# Patient Record
Sex: Female | Born: 1935 | Race: White | Hispanic: No | Marital: Married | Smoking: Never smoker
Health system: Southern US, Community
[De-identification: ages and names within clinical notes are randomized; demographics above are authoritative.]

## PROBLEM LIST (undated history)

## (undated) DIAGNOSIS — M1990 Unspecified osteoarthritis, unspecified site: Secondary | ICD-10-CM

## (undated) DIAGNOSIS — R112 Nausea with vomiting, unspecified: Secondary | ICD-10-CM

## (undated) DIAGNOSIS — D126 Benign neoplasm of colon, unspecified: Secondary | ICD-10-CM

## (undated) DIAGNOSIS — N952 Postmenopausal atrophic vaginitis: Secondary | ICD-10-CM

## (undated) DIAGNOSIS — Z9889 Other specified postprocedural states: Secondary | ICD-10-CM

## (undated) DIAGNOSIS — L900 Lichen sclerosus et atrophicus: Secondary | ICD-10-CM

## (undated) HISTORY — DX: Other specified postprocedural states: Z98.890

## (undated) HISTORY — DX: Lichen sclerosus et atrophicus: L90.0

## (undated) HISTORY — PX: TONSILLECTOMY AND ADENOIDECTOMY: SHX28

## (undated) HISTORY — DX: Unspecified osteoarthritis, unspecified site: M19.90

## (undated) HISTORY — DX: Postmenopausal atrophic vaginitis: N95.2

## (undated) HISTORY — DX: Nausea with vomiting, unspecified: R11.2

## (undated) HISTORY — DX: Benign neoplasm of colon, unspecified: D12.6

---

## 1999-04-12 ENCOUNTER — Encounter: Admission: RE | Admit: 1999-04-12 | Discharge: 1999-04-12 | Payer: Self-pay | Admitting: Obstetrics and Gynecology

## 1999-04-20 ENCOUNTER — Other Ambulatory Visit: Admission: RE | Admit: 1999-04-20 | Discharge: 1999-04-20 | Payer: Self-pay | Admitting: Obstetrics and Gynecology

## 1999-06-21 ENCOUNTER — Encounter: Payer: Self-pay | Admitting: Obstetrics and Gynecology

## 1999-06-21 ENCOUNTER — Encounter: Admission: RE | Admit: 1999-06-21 | Discharge: 1999-06-21 | Payer: Self-pay | Admitting: Internal Medicine

## 2000-04-13 ENCOUNTER — Encounter: Admission: RE | Admit: 2000-04-13 | Discharge: 2000-04-13 | Payer: Self-pay | Admitting: Obstetrics and Gynecology

## 2000-04-13 ENCOUNTER — Encounter: Payer: Self-pay | Admitting: Obstetrics and Gynecology

## 2000-05-22 ENCOUNTER — Other Ambulatory Visit: Admission: RE | Admit: 2000-05-22 | Discharge: 2000-05-22 | Payer: Self-pay | Admitting: Obstetrics and Gynecology

## 2001-05-01 ENCOUNTER — Encounter: Admission: RE | Admit: 2001-05-01 | Discharge: 2001-05-01 | Payer: Self-pay | Admitting: Obstetrics and Gynecology

## 2001-05-01 ENCOUNTER — Encounter: Payer: Self-pay | Admitting: Obstetrics and Gynecology

## 2001-05-11 ENCOUNTER — Other Ambulatory Visit: Admission: RE | Admit: 2001-05-11 | Discharge: 2001-05-11 | Payer: Self-pay | Admitting: *Deleted

## 2002-05-08 ENCOUNTER — Encounter: Payer: Self-pay | Admitting: Obstetrics and Gynecology

## 2002-05-08 ENCOUNTER — Encounter: Admission: RE | Admit: 2002-05-08 | Discharge: 2002-05-08 | Payer: Self-pay | Admitting: Radiology

## 2002-05-15 ENCOUNTER — Other Ambulatory Visit: Admission: RE | Admit: 2002-05-15 | Discharge: 2002-05-15 | Payer: Self-pay | Admitting: *Deleted

## 2003-05-20 ENCOUNTER — Encounter: Admission: RE | Admit: 2003-05-20 | Discharge: 2003-05-20 | Payer: Self-pay | Admitting: Obstetrics and Gynecology

## 2003-05-21 ENCOUNTER — Other Ambulatory Visit: Admission: RE | Admit: 2003-05-21 | Discharge: 2003-05-21 | Payer: Self-pay | Admitting: Obstetrics and Gynecology

## 2004-05-20 ENCOUNTER — Encounter: Admission: RE | Admit: 2004-05-20 | Discharge: 2004-05-20 | Payer: Self-pay | Admitting: Obstetrics and Gynecology

## 2004-07-02 ENCOUNTER — Other Ambulatory Visit: Admission: RE | Admit: 2004-07-02 | Discharge: 2004-07-02 | Payer: Self-pay | Admitting: Obstetrics and Gynecology

## 2005-06-10 ENCOUNTER — Encounter: Admission: RE | Admit: 2005-06-10 | Discharge: 2005-06-10 | Payer: Self-pay | Admitting: Obstetrics and Gynecology

## 2005-11-15 ENCOUNTER — Other Ambulatory Visit: Admission: RE | Admit: 2005-11-15 | Discharge: 2005-11-15 | Payer: Self-pay | Admitting: Obstetrics & Gynecology

## 2005-11-25 ENCOUNTER — Encounter: Admission: RE | Admit: 2005-11-25 | Discharge: 2005-11-25 | Payer: Self-pay | Admitting: Obstetrics and Gynecology

## 2006-06-15 ENCOUNTER — Encounter: Admission: RE | Admit: 2006-06-15 | Discharge: 2006-06-15 | Payer: Self-pay | Admitting: Family Medicine

## 2006-07-04 DIAGNOSIS — D126 Benign neoplasm of colon, unspecified: Secondary | ICD-10-CM

## 2006-07-04 HISTORY — DX: Benign neoplasm of colon, unspecified: D12.6

## 2007-03-07 ENCOUNTER — Ambulatory Visit: Payer: Self-pay | Admitting: Gastroenterology

## 2007-03-23 ENCOUNTER — Encounter: Payer: Self-pay | Admitting: Gastroenterology

## 2007-03-23 ENCOUNTER — Ambulatory Visit: Payer: Self-pay | Admitting: Gastroenterology

## 2007-06-19 ENCOUNTER — Encounter: Admission: RE | Admit: 2007-06-19 | Discharge: 2007-06-19 | Payer: Self-pay | Admitting: Family Medicine

## 2008-02-29 ENCOUNTER — Other Ambulatory Visit: Admission: RE | Admit: 2008-02-29 | Discharge: 2008-02-29 | Payer: Self-pay | Admitting: Obstetrics and Gynecology

## 2008-07-08 ENCOUNTER — Encounter: Admission: RE | Admit: 2008-07-08 | Discharge: 2008-07-08 | Payer: Self-pay | Admitting: Family Medicine

## 2009-07-09 ENCOUNTER — Encounter: Admission: RE | Admit: 2009-07-09 | Discharge: 2009-07-09 | Payer: Self-pay | Admitting: Family Medicine

## 2010-07-21 ENCOUNTER — Encounter
Admission: RE | Admit: 2010-07-21 | Discharge: 2010-07-21 | Payer: Self-pay | Source: Home / Self Care | Attending: Family Medicine | Admitting: Family Medicine

## 2010-07-25 ENCOUNTER — Encounter: Payer: Self-pay | Admitting: Family Medicine

## 2010-07-27 ENCOUNTER — Encounter
Admission: RE | Admit: 2010-07-27 | Discharge: 2010-07-27 | Payer: Self-pay | Source: Home / Self Care | Attending: Family Medicine | Admitting: Family Medicine

## 2011-05-05 LAB — HM PAP SMEAR

## 2011-08-15 ENCOUNTER — Other Ambulatory Visit: Payer: Self-pay | Admitting: Family Medicine

## 2011-08-15 DIAGNOSIS — Z1231 Encounter for screening mammogram for malignant neoplasm of breast: Secondary | ICD-10-CM

## 2011-08-23 ENCOUNTER — Ambulatory Visit
Admission: RE | Admit: 2011-08-23 | Discharge: 2011-08-23 | Disposition: A | Discharge status: Home / Self Care | Payer: Medicare Other | Source: Ambulatory Visit | Attending: Family Medicine | Admitting: Family Medicine

## 2011-08-23 DIAGNOSIS — Z1231 Encounter for screening mammogram for malignant neoplasm of breast: Secondary | ICD-10-CM

## 2012-02-01 ENCOUNTER — Encounter: Payer: Self-pay | Admitting: Gastroenterology

## 2012-06-18 ENCOUNTER — Ambulatory Visit (INDEPENDENT_AMBULATORY_CARE_PROVIDER_SITE_OTHER): Payer: Medicare Other | Admitting: Sports Medicine

## 2012-06-18 VITALS — BP 179/82 | Ht 59.0 in | Wt 140.0 lb

## 2012-06-18 DIAGNOSIS — M1710 Unilateral primary osteoarthritis, unspecified knee: Secondary | ICD-10-CM

## 2012-06-18 DIAGNOSIS — M1712 Unilateral primary osteoarthritis, left knee: Secondary | ICD-10-CM

## 2012-06-18 DIAGNOSIS — M25561 Pain in right knee: Secondary | ICD-10-CM

## 2012-06-18 DIAGNOSIS — M25569 Pain in unspecified knee: Secondary | ICD-10-CM

## 2012-06-18 DIAGNOSIS — IMO0002 Reserved for concepts with insufficient information to code with codable children: Secondary | ICD-10-CM

## 2012-06-18 DIAGNOSIS — M1711 Unilateral primary osteoarthritis, right knee: Secondary | ICD-10-CM

## 2012-06-18 DIAGNOSIS — M25562 Pain in left knee: Secondary | ICD-10-CM

## 2012-06-19 NOTE — Progress Notes (Signed)
  Subjective:    Patient ID: Vanessa Clements, female    DOB: 1936/01/24, 76 y.o.   MRN: [id number]  HPI chief complaint: Bilateral knee pain   Very pleasant 76 year old female comes in today complaining of bilateral knee pain. I cared for this patient for many years at [facility]. 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 She comes in periodically for cortisone injections. Last injections provided her with about 6 months of relief. She denies any new trauma. Pain is diffuse in both knees, worse with activity. Pain is identical in nature to what he's experienced previously with her DJD. She is requesting repeat cortisone injections today.  Medications include lisinopril, vitamin D She is allergic to codeine Socially she does not smoke, denies alcohol use, and is retired   Review of Systems     Objective:   Physical Exam Well-developed, well-nourished. No acute distress. Awake alert and oriented x3  Each of her knees shows a 5 extension lag with flexion to 110. No effusion. Bony hypertrophy consistent with DJD. No popliteal cyst. She is tender to palpation along the medial joint lines bilaterally. Knees are grossly stable to ligamentous exam. She is neurovascularly intact distally. She walks with a varus thrust.  X-rays were not repeated. Last x-rays done at [facility] showed bone-on-bone end-stage DJD in each knee       Assessment & Plan:  1. Bilateral knee pain secondary to severe, end-stage, bone-on-bone DJD  Patient is not interested in total knee arthroplasty. Cortisone injections do provide her with several months of symptom relief. I've agreed to reinject each of her knees today. Left knee was injected using an anterior lateral approach and the right knee was injected using an anterior medial approach. Patient tolerated this without difficulty. Resume activity as tolerated. Although a when  necessary.  Consent obtained and verified. Time-out conducted. Noted no overlying erythema, induration, or other signs of local infection. Skin prepped in a sterile fashion. Topical analgesic spray: Ethyl chloride. Joint: right knee Needle: 25g 1 1/2 inch Completed without difficulty. Meds: 3cc 0.5% marcaine, 1cc(40mg ) depomedrol  Advised to call if fevers/chills, erythema, induration, drainage, or persistent bleeding.  Consent obtained and verified. Time-out conducted. Noted no overlying erythema, induration, or other signs of local infection. Skin prepped in a sterile fashion. Topical analgesic spray: Ethyl chloride. Joint: left knee Needle: 25g 1 1/2 inch Completed without difficulty. Meds: 3cc 0.5% marcaine, 1cc (40mg ) depomedrol  Advised to call if fevers/chills, erythema, induration, drainage, or persistent bleeding.

## 2012-08-20 ENCOUNTER — Other Ambulatory Visit: Payer: Self-pay | Admitting: Family Medicine

## 2012-08-20 DIAGNOSIS — Z1231 Encounter for screening mammogram for malignant neoplasm of breast: Secondary | ICD-10-CM

## 2012-09-13 ENCOUNTER — Ambulatory Visit: Payer: Medicare Other

## 2012-09-24 ENCOUNTER — Ambulatory Visit
Admission: RE | Admit: 2012-09-24 | Discharge: 2012-09-24 | Disposition: A | Discharge status: Home / Self Care | Payer: Medicare Other | Source: Ambulatory Visit | Attending: Family Medicine | Admitting: Family Medicine

## 2012-09-24 DIAGNOSIS — Z1231 Encounter for screening mammogram for malignant neoplasm of breast: Secondary | ICD-10-CM

## 2012-10-18 ENCOUNTER — Encounter: Payer: Self-pay | Admitting: Gastroenterology

## 2012-12-12 ENCOUNTER — Ambulatory Visit (INDEPENDENT_AMBULATORY_CARE_PROVIDER_SITE_OTHER): Payer: Medicare Other | Admitting: Sports Medicine

## 2012-12-12 ENCOUNTER — Encounter: Payer: Self-pay | Admitting: Sports Medicine

## 2012-12-12 VITALS — BP 181/79 | HR 81 | Ht 59.0 in | Wt 140.0 lb

## 2012-12-12 DIAGNOSIS — M1711 Unilateral primary osteoarthritis, right knee: Secondary | ICD-10-CM

## 2012-12-12 DIAGNOSIS — M1710 Unilateral primary osteoarthritis, unspecified knee: Secondary | ICD-10-CM

## 2012-12-12 DIAGNOSIS — M25562 Pain in left knee: Secondary | ICD-10-CM

## 2012-12-12 DIAGNOSIS — IMO0002 Reserved for concepts with insufficient information to code with codable children: Secondary | ICD-10-CM

## 2012-12-12 DIAGNOSIS — M1712 Unilateral primary osteoarthritis, left knee: Secondary | ICD-10-CM

## 2012-12-12 DIAGNOSIS — M25569 Pain in unspecified knee: Secondary | ICD-10-CM

## 2012-12-12 DIAGNOSIS — M25561 Pain in right knee: Secondary | ICD-10-CM

## 2012-12-12 MED ORDER — METHYLPREDNISOLONE ACETATE 40 MG/ML IJ SUSP
40.0000 mg | Freq: Once | INTRAMUSCULAR | Status: AC
  Administered 2012-12-12: 40 mg via INTRA_ARTICULAR

## 2012-12-12 NOTE — Progress Notes (Signed)
  Subjective:    Patient ID: Vanessa Clements, female    DOB: [birth year], 77 y.o.   MRN: [id number]  HPI Patient comes in today requesting repeat cortisone injections into each of her knees. She has a well-documented history of end-stage bilateral knee DJD. She is not currently interested in total knee arthroplasty. Knees were last injected back in December. She denies any recent trauma.  Interim medical history is unchanged. She takes lisinopril and vitamin D    Review of Systems     Objective:   Physical Exam Well-developed, well-nourished. No acute distress.  Examination of each of her knees shows range of motion from 0-120. 1+ patellofemoral crepitus. Moderate varus deformity bilaterally. No effusion. Slight tenderness to palpation along the medial joint line. Negative McMurray's. Knees are grossly stable to ligamentous exam. Neurovascularly intact distally. Walking with only a slight limp.       Assessment & Plan:  1. Returning bilateral knee pain secondary to end-stage DJD  Again, patient is not interested in total knee arthroplasty. I have agreed to reinject each of her knees. Left knee was injected using an anterior lateral approach and the right knee was injected using an anterior medial approach. I've agreed to reinject her knees in 6 months if needed.  Consent obtained and verified. Time-out conducted. Noted no overlying erythema, induration, or other signs of local infection. Skin prepped in a sterile fashion. Topical analgesic spray: Ethyl chloride. Joint: right knee Needle: 25g 1 1/2 inch Completed without difficulty. Meds: 3cc 1% xylocaine, 1cc (40mg ) depomedrol  Advised to call if fevers/chills, erythema, induration, drainage, or persistent bleeding.  Consent obtained and verified. Time-out conducted. Noted no overlying erythema, induration, or other signs of local infection. Skin prepped in a sterile fashion. Topical analgesic spray: Ethyl  chloride. Joint: left knee Needle: 25g 1 1/2 inch Completed without difficulty. Meds: 3cc 1% xylocaine, 1cc (40mg ) depomedrol  Advised to call if fevers/chills, erythema, induration, drainage, or persistent bleeding.

## 2012-12-13 ENCOUNTER — Other Ambulatory Visit: Payer: Self-pay | Admitting: Dermatology

## 2013-09-03 ENCOUNTER — Other Ambulatory Visit: Payer: Self-pay

## 2013-09-03 DIAGNOSIS — Z1231 Encounter for screening mammogram for malignant neoplasm of breast: Secondary | ICD-10-CM

## 2013-09-25 ENCOUNTER — Ambulatory Visit
Admission: RE | Admit: 2013-09-25 | Discharge: 2013-09-25 | Disposition: A | Discharge status: Home / Self Care | Payer: Medicare Other | Source: Ambulatory Visit

## 2013-09-25 DIAGNOSIS — Z1231 Encounter for screening mammogram for malignant neoplasm of breast: Secondary | ICD-10-CM

## 2013-10-03 ENCOUNTER — Ambulatory Visit: Payer: Self-pay | Admitting: Nurse Practitioner

## 2013-10-16 ENCOUNTER — Other Ambulatory Visit: Payer: Self-pay | Admitting: Dermatology

## 2013-10-29 ENCOUNTER — Ambulatory Visit: Payer: Self-pay | Admitting: Nurse Practitioner

## 2013-11-05 ENCOUNTER — Encounter: Payer: Self-pay | Admitting: Nurse Practitioner

## 2013-11-06 ENCOUNTER — Ambulatory Visit (INDEPENDENT_AMBULATORY_CARE_PROVIDER_SITE_OTHER): Payer: Medicare Other | Admitting: Nurse Practitioner

## 2013-11-06 ENCOUNTER — Encounter: Payer: Self-pay | Admitting: Nurse Practitioner

## 2013-11-06 VITALS — BP 134/68 | HR 68 | Resp 16 | Ht 58.5 in | Wt 155.0 lb

## 2013-11-06 DIAGNOSIS — Z01419 Encounter for gynecological examination (general) (routine) without abnormal findings: Secondary | ICD-10-CM

## 2013-11-06 MED ORDER — ESTROGENS, CONJUGATED 0.625 MG/GM VA CREA: 1.0000 | TOPICAL_CREAM | VAGINAL | Status: DC | PRN

## 2013-11-06 NOTE — Patient Instructions (Signed)

## 2013-11-06 NOTE — Progress Notes (Signed)
78 y.o. G43P2002 Married Caucasian Fe here for annual exam.  No vaso symptoms.  Using vaginal cream usually before exams other times is rarely.  Not SA now.  Patient's last menstrual period was 07/04/1998.          Sexually active: no  The current method of family planning is vasectomy.    Exercising: no  exercise Smoker:  no  Health Maintenance: Pap:  05-05-11 neg MMG:  09-25-13 normal Colonoscopy:  9/08 due in 2013 BMD:   2011 TDaP:  Unsure but given within 10 Labs: none Self breast exam: not done   reports that she has never smoked. She has never used smokeless tobacco. She reports that she does not drink alcohol or use illicit drugs.  Past Medical History  Diagnosis Date  . Lichen sclerosus   . Adenomatous polyp of colon 2008  . Atrophic vaginitis   . Arthritis     hands & knees    Past Surgical History  Procedure Laterality Date  . Tonsillectomy and adenoidectomy  childhood    Current Outpatient Prescriptions  Medication Sig Dispense Refill  . cetirizine (ZYRTEC) 10 MG tablet Take 10 mg by mouth daily.      . cholecalciferol (VITAMIN D) 1000 UNITS tablet Take 2,000 Units by mouth daily.      Marland Kitchen conjugated estrogens (PREMARIN) vaginal cream Place 1 Applicatorful vaginally as needed.  42.5 g  3  . lisinopril (PRINIVIL,ZESTRIL) 10 MG tablet Take 10 mg by mouth daily.      . Vitamin D, Ergocalciferol, (DRISDOL) 50000 UNITS CAPS capsule Take 50,000 Units by mouth every 7 (seven) days.       No current facility-administered medications for this visit.    Family History  Problem Relation Age of Onset  . Thyroid disease Mother   . Osteoarthritis Mother   . Cancer Father     prostate & spread to lungs  . Thyroid disease Maternal Grandmother     goiter    ROS:  Pertinent items are noted in HPI.  Otherwise, a comprehensive ROS was negative.  Exam:   BP 134/68  Pulse 68  Resp 16  Ht 4' 10.5" (1.486 m)  Wt 155 lb (70.308 kg)  BMI 31.84 kg/m2  LMP 07/04/1998 Height:  4' 10.5" (148.6 cm)  Ht Readings from Last 3 Encounters:  11/06/13 4' 10.5" (1.486 m)  12/12/12 4\' 11"  (1.499 m)  06/18/12 4\' 11"  (1.499 m)    General appearance: alert, cooperative and appears stated age Head: Normocephalic, without obvious abnormality, atraumatic Neck: no adenopathy, supple, symmetrical, trachea midline and thyroid normal to inspection and palpation Lungs: clear to auscultation bilaterally Breasts: normal appearance, no masses or tenderness Heart: regular rate and rhythm Abdomen: soft, non-tender; no masses,  no organomegaly Extremities: extremities normal, atraumatic, no cyanosis or edema Skin: Skin color, texture, turgor normal. No rashes or lesions Lymph nodes: Cervical, supraclavicular, and axillary nodes normal. No abnormal inguinal nodes palpated Neurologic: Grossly normal   Pelvic: External genitalia:  no lesions              Urethra:  normal appearing urethra with no masses, tenderness or lesions              Bartholin's and Skene's: normal                 Vagina: very atrophic appearing vagina with pale color and discharge, no lesions              Cervix: anteverted  Pap taken: yes Bimanual Exam:  Uterus:  normal size, contour, position, consistency, mobility, non-tender              Adnexa: no mass, fullness, tenderness               Rectovaginal: Confirms               Anus:  normal sphincter tone, no lesions  A:  Well Woman with normal exam  Postmenopausal off HRT 2000  Atrophic vaginitis - rare use of vaginal estrogen  P:   Reviewed health and wellness pertinent to exam  Pap smear taken today - request one every 2 years  Mammogram due 3/16  Refilled Premarin Vaginal cream in case it is needed  Counseled with risk of DVT, CVA, cancer, etc  Counseled on breast self exam, mammography screening, adequate intake of calcium and vitamin D, diet and exercise, Kegel's exercises return annually or prn  An After Visit Summary was printed  and given to the patient.

## 2013-11-10 NOTE — Progress Notes (Signed)
Encounter reviewed by Dr. Brook Silva.  

## 2013-11-11 LAB — IPS PAP SMEAR ONLY

## 2014-01-08 ENCOUNTER — Encounter: Payer: Self-pay | Admitting: Sports Medicine

## 2014-01-08 ENCOUNTER — Ambulatory Visit (INDEPENDENT_AMBULATORY_CARE_PROVIDER_SITE_OTHER): Payer: Medicare Other | Admitting: Sports Medicine

## 2014-01-08 VITALS — BP 148/80 | Ht 59.0 in | Wt 145.0 lb

## 2014-01-08 DIAGNOSIS — M1712 Unilateral primary osteoarthritis, left knee: Secondary | ICD-10-CM

## 2014-01-08 DIAGNOSIS — M1710 Unilateral primary osteoarthritis, unspecified knee: Secondary | ICD-10-CM

## 2014-01-08 MED ORDER — METHYLPREDNISOLONE ACETATE 40 MG/ML IJ SUSP
40.0000 mg | Freq: Once | INTRAMUSCULAR | Status: AC
  Administered 2014-01-08: 40 mg via INTRA_ARTICULAR

## 2014-01-09 NOTE — Progress Notes (Signed)
   Subjective:    Patient ID: Vanessa Clements, female    DOB: Jun 08, 1936, 78 y.o.   MRN: [id number]  HPI Patient comes in today requesting a repeat cortisone injection into her left knee. She has a well-documented history of end-stage DJD in both knees. Although she is getting pain in both knees currently, left knee is most painful and this is the knee that she would like to have injected today. No trauma. She also gets stiffness with prolonged sitting. No swelling.    Review of Systems     Objective:   Physical Exam Well-developed, well-nourished. No acute distress. Vital signs reviewed. Awake alert and oriented x3.  Left knee: Range of motion 0-120. 1+ body synovitis but no effusion. Slight tenderness to palpation along the medial joint line but not markedly. No Baker's cyst. Neurovascularly intact distally. Walking with a slight limp.       Assessment & Plan:  Returning left knee pain secondary to end-stage DJD  Left knee is reinjected today with cortisone. An anterior lateral approach was utilized. Patient tolerated this without difficulty. If right knee becomes more symptomatic she will return to the office for consideration of repeat cortisone injection here. Otherwise, followup when necessary.  Consent obtained and verified. Time-out conducted. Noted no overlying erythema, induration, or other signs of local infection. Skin prepped in a sterile fashion. Topical analgesic spray: Ethyl chloride. Joint: left knee Needle: 22g 1.5 inch Completed without difficulty. Meds: 3cc 1% xylocaine, 1cc (40mg ) depomedrol  Advised to call if fevers/chills, erythema, induration, drainage, or persistent bleeding.

## 2014-05-05 ENCOUNTER — Encounter: Payer: Self-pay | Admitting: Sports Medicine

## 2014-05-22 ENCOUNTER — Other Ambulatory Visit: Payer: Self-pay | Admitting: Otolaryngology

## 2014-05-22 DIAGNOSIS — IMO0002 Reserved for concepts with insufficient information to code with codable children: Secondary | ICD-10-CM

## 2014-06-05 ENCOUNTER — Ambulatory Visit
Admission: RE | Admit: 2014-06-05 | Discharge: 2014-06-05 | Disposition: A | Discharge status: Home / Self Care | Payer: Medicare Other | Source: Ambulatory Visit | Attending: Otolaryngology | Admitting: Otolaryngology

## 2014-06-05 DIAGNOSIS — IMO0002 Reserved for concepts with insufficient information to code with codable children: Secondary | ICD-10-CM

## 2014-06-20 ENCOUNTER — Other Ambulatory Visit: Payer: Self-pay | Admitting: Otolaryngology

## 2014-06-20 DIAGNOSIS — IMO0002 Reserved for concepts with insufficient information to code with codable children: Secondary | ICD-10-CM

## 2014-07-08 ENCOUNTER — Other Ambulatory Visit: Payer: Self-pay | Admitting: Otolaryngology

## 2014-07-08 DIAGNOSIS — IMO0002 Reserved for concepts with insufficient information to code with codable children: Secondary | ICD-10-CM

## 2014-07-10 ENCOUNTER — Ambulatory Visit
Admission: RE | Admit: 2014-07-10 | Discharge: 2014-07-10 | Disposition: A | Discharge status: Home / Self Care | Payer: Medicare Other | Source: Ambulatory Visit | Attending: Otolaryngology | Admitting: Otolaryngology

## 2014-07-10 ENCOUNTER — Other Ambulatory Visit (HOSPITAL_COMMUNITY)
Admission: RE | Admit: 2014-07-10 | Discharge: 2014-07-10 | Disposition: A | Discharge status: Home / Self Care | Payer: Medicare Other | Source: Ambulatory Visit | Attending: Interventional Radiology | Admitting: Interventional Radiology

## 2014-07-10 ENCOUNTER — Ambulatory Visit
Admission: RE | Admit: 2014-07-10 | Discharge: 2014-07-10 | Disposition: A | Discharge status: Home / Self Care | Payer: Self-pay | Source: Ambulatory Visit | Attending: Otolaryngology | Admitting: Otolaryngology

## 2014-07-10 DIAGNOSIS — IMO0002 Reserved for concepts with insufficient information to code with codable children: Secondary | ICD-10-CM

## 2014-07-10 DIAGNOSIS — E041 Nontoxic single thyroid nodule: Secondary | ICD-10-CM | POA: Diagnosis present

## 2014-08-26 ENCOUNTER — Other Ambulatory Visit: Payer: Self-pay

## 2014-08-26 DIAGNOSIS — Z1231 Encounter for screening mammogram for malignant neoplasm of breast: Secondary | ICD-10-CM

## 2014-10-07 ENCOUNTER — Ambulatory Visit: Payer: Medicare Other

## 2014-10-20 ENCOUNTER — Ambulatory Visit
Admission: RE | Admit: 2014-10-20 | Discharge: 2014-10-20 | Disposition: A | Discharge status: Home / Self Care | Payer: Medicare Other | Source: Ambulatory Visit

## 2014-10-20 DIAGNOSIS — Z1231 Encounter for screening mammogram for malignant neoplasm of breast: Secondary | ICD-10-CM

## 2014-12-05 ENCOUNTER — Other Ambulatory Visit: Payer: Self-pay | Admitting: Otolaryngology

## 2014-12-05 DIAGNOSIS — IMO0002 Reserved for concepts with insufficient information to code with codable children: Secondary | ICD-10-CM

## 2014-12-23 ENCOUNTER — Ambulatory Visit
Admission: RE | Admit: 2014-12-23 | Discharge: 2014-12-23 | Disposition: A | Discharge status: Home / Self Care | Payer: Medicare Other | Source: Ambulatory Visit | Attending: Otolaryngology | Admitting: Otolaryngology

## 2014-12-23 ENCOUNTER — Other Ambulatory Visit (HOSPITAL_COMMUNITY)
Admission: RE | Admit: 2014-12-23 | Discharge: 2014-12-23 | Disposition: A | Discharge status: Home / Self Care | Payer: Medicare Other | Source: Ambulatory Visit | Attending: Interventional Radiology | Admitting: Interventional Radiology

## 2014-12-23 DIAGNOSIS — E041 Nontoxic single thyroid nodule: Secondary | ICD-10-CM | POA: Diagnosis present

## 2014-12-23 DIAGNOSIS — IMO0002 Reserved for concepts with insufficient information to code with codable children: Secondary | ICD-10-CM

## 2015-02-26 ENCOUNTER — Ambulatory Visit (INDEPENDENT_AMBULATORY_CARE_PROVIDER_SITE_OTHER): Payer: Medicare Other | Admitting: Internal Medicine

## 2015-02-26 ENCOUNTER — Encounter: Payer: Self-pay | Admitting: Internal Medicine

## 2015-02-26 VITALS — BP 122/72 | HR 100 | Temp 98.6°F | Resp 12 | Ht 59.0 in | Wt 164.0 lb

## 2015-02-26 DIAGNOSIS — E042 Nontoxic multinodular goiter: Secondary | ICD-10-CM | POA: Diagnosis not present

## 2015-02-26 NOTE — Patient Instructions (Signed)
Please stop at the lab.  Dr. Gala Lewandowsky office will call you with an appointment.  Please come back for a follow-up appointment in 2 months.

## 2015-02-26 NOTE — Progress Notes (Signed)
Patient ID: Vanessa Clements, female   DOB: [birth year], 79 y.o.   MRN: [id number]   HPI  Vanessa Clements is a 79 y.o.-year-old female, referred by her ENT dr, Dr Dagmar Hait, for evaluation and management of MNG.  Thyroid U/S (12/23/2014): Multiple thyroid nodules, of which the largest is in the right lower lobe, measuring 3 x 4.6 x 1.7 cm. This nodule has been biopsied twice, on 07/15/2014 and on 12/23/2014, and the results were indeterminate (follicular lesion of undetermined significance) in both situations. Another biopsy was performed of the dominant left thyroid nodule, measuring 1.9 x 1.4 x 1.5 cm and was benign on 07/15/2014.    I reviewed pt's thyroid tests: 04/2014: TSH normal by Dr. Ignacia Bayley (ENT)  Pt denies feeling nodules in neck, hoarseness, dysphagia/odynophagia, SOB with lying down.  Pt denies: - heat intolerance/cold intolerance - tremors - palpitations - anxiety/depression - hyperdefecation/constipation - weight loss - weight gain - dry skin - hairloss - fatigue  Pt does have a FH of thyroid ds.: GM with goiter, mother with mild hypothyroidism. No FH of thyroid cancer. No h/o radiation tx to head or neck.  She lived in [place], [place] state - 40 miles away from a Dietitian. She then [place], [place].  No seaweed or kelp, no recent contrast studies. No steroid use. No herbal supplements.   ROS: Constitutional: no weight gain/loss, no fatigue, no subjective hyperthermia/hypothermia Eyes: no blurry vision, no xerophthalmia ENT: no sore throat, no nodules palpated in throat, no dysphagia/odynophagia, no hoarseness, + hypoacusis Cardiovascular: no CP/SOB/palpitations/leg swelling Respiratory: no cough/SOB Gastrointestinal: no N/V/D/C Musculoskeletal: no muscle/+ joint aches Skin: + rash Neurological: no tremors/numbness/tingling/dizziness Psychiatric: no depression/anxiety  Past Medical History  Diagnosis Date  . Lichen  sclerosus   . Adenomatous polyp of colon 2008  . Atrophic vaginitis   . Arthritis     hands & knees   Past Surgical History  Procedure Laterality Date  . Tonsillectomy and adenoidectomy  childhood   Social History   Social History  . Marital Status: Married    Spouse Name: N/A  . Number of Children: 2  . Years of Education: N/A   Occupational History  . Not on file.   Social History Main Topics  . Smoking status: Never Smoker   . Smokeless tobacco: Never Used  . Alcohol Use: No  . Drug Use: No  . Sexual Activity: No     Comment: husband vasectomy   Other Topics Concern  . Not on file   Social History Narrative   Current Outpatient Prescriptions on File Prior to Visit  Medication Sig Dispense Refill  . cetirizine (ZYRTEC) 10 MG tablet Take 10 mg by mouth daily.    . cholecalciferol (VITAMIN D) 1000 UNITS tablet Take 2,000 Units by mouth daily.    Marland Kitchen lisinopril (PRINIVIL,ZESTRIL) 10 MG tablet Take 10 mg by mouth daily.    Marland Kitchen conjugated estrogens (PREMARIN) vaginal cream Place 1 Applicatorful vaginally as needed. (Patient not taking: Reported on 02/26/2015) 42.5 g 3  . Vitamin D, Ergocalciferol, (DRISDOL) 50000 UNITS CAPS capsule Take 50,000 Units by mouth every 7 (seven) days.     No current facility-administered medications on file prior to visit.   Allergies  Allergen Reactions  . Codeine Nausea And Vomiting    fainting  . Neomycin-Bacitracin Zn-Polymyx Other (See Comments)    Blisters skin  . Sulfa Antibiotics    Family History  Problem Relation Age of Onset  .  Thyroid disease Mother   . Osteoarthritis Mother   . Cancer Father     prostate & spread to lungs  . Thyroid disease Maternal Grandmother     goiter   PE: BP 122/72 mmHg  Pulse 100  Temp(Src) 98.6 F (37 C) (Oral)  Resp 12  Ht 4\' 11"  (1.499 m)  Wt 164 lb (74.39 kg)  BMI 33.11 kg/m2  SpO2 97%  LMP 07/04/1998 Wt Readings from Last 3 Encounters:  02/26/15 164 lb (74.39 kg)  01/08/14 145 lb  (65.772 kg)  11/06/13 155 lb (70.308 kg)   Constitutional: overweight, in NAD Eyes: PERRLA, EOMI, no exophthalmos ENT: moist mucous membranes, + thyromegaly R>L , no cervical lymphadenopathy Cardiovascular: RRR, No MRG Respiratory: CTA B Gastrointestinal: abdomen soft, NT, ND, BS+ Musculoskeletal: no deformities, strength intact in all 4;  Skin: moist, warm, no rashes Neurological: no tremor with outstretched hands, DTR normal in all 4  ASSESSMENT: 1. MNG  2. FLUS x 2 in RLL thyroid nodule  PLAN: 1. And 2.  - I reviewed the images of her thyroid ultrasound along with the patient. I pointed out that the  right dominant nodule is large and the biopsy has been inconclusive 2x. This is an indication for surgery. The guidelines recommend lobectomy, however, I'm worried that if this turns out to be cancerous, she will need to have completion thyroidectomy, therefore, 2 surgeries. She is a highly functioning 79 year old woman, looking and acting much younger than her age, however, I would still want to avoid her potentially having to go through 2 surgeries. I had a long discussion with the patient and recommended total thyroidectomy, which she agrees with. I explained that the rest of the nodules are smaller, the left dominant nodule has been biopsied and it has been benign, so there is no clear indication for taking the left thyroid lobe out, the only concern is for need for repeat intervention in case of malignancy. She understands this but would still want to proceed with total thyroidectomy. - We discussed about thyroid cancer, the fact that this is an indolent malignancy, which usually does not reduce her life expectancy. - We also discussed about thyroid hormone replacement after surgery  - I recommended to take the thyroid hormone every day, with water, >30 minutes before breakfast, separated by >4 hours from acid reflux medications, calcium, iron, multivitamins. - I would like to see her  back in approximately 1-1.5 mo  after the surgery - for today, would like to check a TSH, since we do not have any recent level   Lab Results  Component Value Date   TSH 0.927 02/27/2015   TSH is normal.

## 2015-02-27 ENCOUNTER — Other Ambulatory Visit: Payer: Medicare Other

## 2015-02-27 DIAGNOSIS — E042 Nontoxic multinodular goiter: Secondary | ICD-10-CM

## 2015-02-28 LAB — TSH: TSH: 0.927 u[IU]/mL (ref 0.450–4.500)

## 2015-03-02 ENCOUNTER — Telehealth: Payer: Self-pay | Admitting: Internal Medicine

## 2015-03-02 NOTE — Telephone Encounter (Signed)
Patient called stating that she would like to speak with Dr. Arman Filter assistant  She has some concerns she would like to discuss    Please advise   Thank you

## 2015-03-02 NOTE — Telephone Encounter (Signed)
I would highly recommend Dr Harlow Asa. He is operating on a lot thyroid patients with great results. Please do not go by that one bad review. I would suggest that she at least meets with him once the discussed the surgery and then let me know if she wants to see somebody else.  Please let me know what she decides. If she insists on seeing somebody else, we can definitely do that.

## 2015-03-02 NOTE — Telephone Encounter (Signed)
Returned Vanessa Clements's call. Vanessa Clements stated that she looked up the surgeon your recommended to her and she saw a bad review on him; she would like for Dr Cruzita Lederer to refer her to another surgeon, one who does thyroid surgeries more often. Be advised.

## 2015-03-04 NOTE — Telephone Encounter (Signed)
Called pt and advised her per Dr Arman Filter message. Pt stated she understands, but she has never had surgery before and would prefer to be seen by a surgeon who is a thyroid Psychologist, sport and exercise; preferably in [place], [place] or at [facility]. Pt stated she is not trying to be hard to get along with, she just wants to be sure that she gets the best surgeon possible. Pt was very nice about it, just really wants to go to a thyroid surgeon. Be advised.

## 2015-03-05 NOTE — Telephone Encounter (Signed)
I understand that. No problem! Is she interested in seeing Dr. Rico Ala at [facility]? - he is a thyroid surgeon and he is excellent! If not, I will need to find out about a good surgeon in one of the academic centers nearby.

## 2015-03-05 NOTE — Telephone Encounter (Signed)
Pt returned call. She stated that she would prefer to see a surgeon who is closer. [facility] is too far for her at this time. Be advised.

## 2015-03-05 NOTE — Telephone Encounter (Signed)
Called pt and lvm advising her per Dr Arman Filter message below. Advised her to return call.

## 2015-03-06 NOTE — Telephone Encounter (Signed)
Will place the referral. No problem!

## 2015-03-06 NOTE — Telephone Encounter (Signed)
Called pt and advised her per Dr Arman Filter message below. Pt stated that she knows of this physician/surgeon and would like for Dr Cruzita Lederer to go ahead with the referral to her. Thank you so much for working with her she said.

## 2015-03-06 NOTE — Telephone Encounter (Signed)
Can you please tell her to research Dr. Eliezer Champagne at [facility]? I can refer her there. She specializes in endocrine surgery.

## 2015-03-10 ENCOUNTER — Other Ambulatory Visit: Payer: Self-pay | Admitting: Internal Medicine

## 2015-03-10 DIAGNOSIS — E042 Nontoxic multinodular goiter: Secondary | ICD-10-CM

## 2015-04-28 ENCOUNTER — Ambulatory Visit: Payer: Medicare Other | Admitting: Internal Medicine

## 2015-06-04 HISTORY — PX: THYROIDECTOMY: SHX17

## 2015-06-08 ENCOUNTER — Telehealth: Payer: Self-pay | Admitting: Internal Medicine

## 2015-06-08 NOTE — Telephone Encounter (Signed)
Patient called stating that she had her thyroid removed and was advised to see her Endocrinologist 6 weeks after   At this time Dr. Cruzita Lederer does not have an appointment time to accommodate this request  Next available appointment is 1.27.16 at 4pm; did Dr. Cruzita Lederer need to see her sooner than this?  Please advise   Thank you

## 2015-06-08 NOTE — Telephone Encounter (Signed)
Please read message and advise

## 2015-06-08 NOTE — Telephone Encounter (Signed)
Called pt and advised her per Dr Arman Filter message below. Pt voiced understanding. Scheduled labs for 6 weeks post-op, Jan 12th and f/up for Jan 30th. Be advised.

## 2015-06-08 NOTE — Telephone Encounter (Signed)
OK to see her then, but let's order a TSH and a free t4 for 5 weeks after the thyroidectomy.  Thank you!

## 2015-07-16 ENCOUNTER — Other Ambulatory Visit (INDEPENDENT_AMBULATORY_CARE_PROVIDER_SITE_OTHER): Payer: Medicare Other

## 2015-07-16 ENCOUNTER — Other Ambulatory Visit: Payer: Self-pay | Admitting: *Deleted

## 2015-07-16 DIAGNOSIS — E042 Nontoxic multinodular goiter: Secondary | ICD-10-CM

## 2015-07-16 LAB — T4, FREE: FREE T4: 1.31 ng/dL (ref 0.60–1.60)

## 2015-07-16 LAB — TSH: TSH: 3.36 u[IU]/mL (ref 0.35–4.50)

## 2015-07-21 ENCOUNTER — Telehealth: Payer: Self-pay | Admitting: Internal Medicine

## 2015-07-21 MED ORDER — LEVOTHYROXINE SODIUM 112 MCG PO TABS: ORAL_TABLET | ORAL | Status: DC

## 2015-07-21 NOTE — Telephone Encounter (Signed)
Pt needs levothyroxine refill called to [facility] on [address]

## 2015-08-03 ENCOUNTER — Ambulatory Visit (INDEPENDENT_AMBULATORY_CARE_PROVIDER_SITE_OTHER): Payer: Medicare Other | Admitting: Internal Medicine

## 2015-08-03 ENCOUNTER — Encounter: Payer: Self-pay | Admitting: Internal Medicine

## 2015-08-03 VITALS — BP 124/82 | HR 86 | Temp 97.8°F | Resp 12 | Wt 163.8 lb

## 2015-08-03 DIAGNOSIS — E890 Postprocedural hypothyroidism: Secondary | ICD-10-CM | POA: Diagnosis not present

## 2015-08-03 DIAGNOSIS — Z8639 Personal history of other endocrine, nutritional and metabolic disease: Secondary | ICD-10-CM | POA: Diagnosis not present

## 2015-08-03 NOTE — Progress Notes (Signed)
Patient ID: Vanessa Clements, female   DOB: March 07, 1936, 80 y.o.   MRN: [id number]   HPI  Vanessa Clements is a 80 y.o.-year-old female, initially referred by her ENT dr, Dr Dagmar Hait, now returning for f/u for postsurgical hypothyroidism after total thyroidectomy for MNG. Last visit 5 mo ago.  Pt has a h/o MNG, with 2x FLUS biopsies.   Thyroid U/S (12/23/2014): Multiple thyroid nodules, of which the largest is in the right lower lobe, measuring 3 x 4.6 x 1.7 cm. This nodule has been biopsied twice, on 07/15/2014 and on 12/23/2014, and the results were indeterminate (follicular lesion of undetermined significance) in both situations. Another biopsy was performed of the dominant left thyroid nodule, measuring 1.9 x 1.4 x 1.5 cm and was benign on 07/15/2014.    She had total thyroidectomy at [facility] (Dr Eliezer Champagne) on 06/04/2015 - had a great experience! She feels well after the surgery, has plenty of energy and no complaints.  I reviewed pt's thyroid tests: Lab Results  Component Value Date   TSH 3.36 07/16/2015   TSH 0.927 02/27/2015   FREET4 1.31 07/16/2015  04/2014: TSH normal by Dr. Ignacia Bayley (ENT)  She is now on Levothyroxine 112 mcg daily: - in am - with water - 1h before b'fast - calcium 600 mg at night   Pt denies hoarseness, dysphagia/odynophagia, dysesthesia at the cervical scar site.  Pt does have a FH of thyroid ds.: GM with goiter, mother with mild hypothyroidism. No FH of thyroid cancer. No h/o radiation tx to head or neck.  ROS: Constitutional: no weight gain/loss, no fatigue, no subjective hyperthermia/hypothermia Eyes: no blurry vision, no xerophthalmia ENT: no sore throat, no nodules palpated in throat, no dysphagia/odynophagia, no hoarseness Cardiovascular: no CP/SOB/palpitations/leg swelling Respiratory: no cough/SOB Gastrointestinal: no N/V/D/C Musculoskeletal: no muscle/+ joint aches Skin: no rash Neurological: no  tremors/numbness/tingling/dizziness  I reviewed pt's medications, allergies, PMH, social hx, family hx, and changes were documented in the history of present illness. Otherwise, unchanged from my initial visit note.  Past Medical History  Diagnosis Date  . Lichen sclerosus   . Adenomatous polyp of colon 2008  . Atrophic vaginitis   . Arthritis     hands & knees   Past Surgical History  Procedure Laterality Date  . Tonsillectomy and adenoidectomy  childhood   Social History   Social History  . Marital Status: Married    Spouse Name: N/A  . Number of Children: 2  . Years of Education: N/A   Occupational History  . Not on file.   Social History Main Topics  . Smoking status: Never Smoker   . Smokeless tobacco: Never Used  . Alcohol Use: No  . Drug Use: No  . Sexual Activity: No     Comment: husband vasectomy   Other Topics Concern  . Not on file   Social History Narrative   Current Outpatient Prescriptions on File Prior to Visit  Medication Sig Dispense Refill  . cetirizine (ZYRTEC) 10 MG tablet Take 10 mg by mouth daily.    . cholecalciferol (VITAMIN D) 1000 UNITS tablet Take 2,000 Units by mouth daily.    Marland Kitchen levothyroxine (SYNTHROID, LEVOTHROID) 112 MCG tablet TAKE 1 TAB EVERY MORNING 30-60 MINUTES BEFORE BREAKFAST ON EMPTY STOMACH WITH GLASS OF WATER 45 tablet 2  . lisinopril (PRINIVIL,ZESTRIL) 10 MG tablet Take 10 mg by mouth daily.     No current facility-administered medications on file prior to visit.   Allergies  Allergen Reactions  .  Codeine Nausea And Vomiting    fainting  . Neomycin-Bacitracin Zn-Polymyx Other (See Comments)    Blisters skin  . Sulfa Antibiotics    Family History  Problem Relation Age of Onset  . Thyroid disease Mother   . Osteoarthritis Mother   . Cancer Father     prostate & spread to lungs  . Thyroid disease Maternal Grandmother     goiter   PE: BP 124/82 mmHg  Pulse 86  Temp(Src) 97.8 F (36.6 C) (Oral)  Resp 12  Wt  163 lb 12.8 oz (74.299 kg)  SpO2 95%  LMP 07/04/1998 Wt Readings from Last 3 Encounters:  08/03/15 163 lb 12.8 oz (74.299 kg)  02/26/15 164 lb (74.39 kg)  01/08/14 145 lb (65.772 kg)   Constitutional: overweight, in NAD Eyes: PERRLA, EOMI, no exophthalmos ENT: moist mucous membranes, thyroidectomy scar slightly erythematous, nonpainful, w/o swelling , no cervical lymphadenopathy Cardiovascular: RRR, No MRG Respiratory: CTA B Gastrointestinal: abdomen soft, NT, ND, BS+ Musculoskeletal: no deformities, strength intact in all 4;  Skin: moist, warm, no rashes Neurological: no tremor with outstretched hands, DTR normal in all 4  ASSESSMENT: 1. H/o MNG - FLUS x 2 in RLL thyroid nodule - now s/p total thyroidectomy at [facility]  2. Postsurgical hypothyroidism  PLAN: 1.  - Pt with a h/o multinodular goiter, with one nodule biopsy beeing inconclusive 2x.  I recommended surgery especially to avoid a reintervention in case se turned out to have cancer >> she had the surgery 2 months ago and the pathology was benign. She is not having any complaints about her thyroidectomy scar.  2. Postsurgical hypothyroidism - on LT4 112 mcg, with normal TFTs at last check this month - we discussed about taking the thyroid hormone every day, with water, at least 30 minutes before breakfast, separated by at least 4 hours from: - acid reflux medications - calcium - iron - multivitamins She is taking it correctly - will recheck TFTs in 4-5 weeks  3. Postsurgical hypocalcemia - resolved, but last calcium level at the [facility] per [facility] records - continue Calcium 600 mg daily for now and recheck Calcium at next lab draw in 1 mo >> will try to reduce her dose then

## 2015-08-03 NOTE — Patient Instructions (Signed)
Please come back for labs in 4-5 weeks.  Please continue Calcium 600 mg daily for now.  Continue Levothyroxine 112 mcg daily.  Take the thyroid hormone every day, with water, at least 30 minutes before breakfast, separated by at least 4 hours from: - acid reflux medications - calcium - iron - multivitamins  Please come back for a follow-up appointment in 6 months.

## 2015-08-26 ENCOUNTER — Other Ambulatory Visit: Payer: Self-pay | Admitting: *Deleted

## 2015-08-26 MED ORDER — LEVOTHYROXINE SODIUM 112 MCG PO TABS: ORAL_TABLET | ORAL | Status: DC

## 2015-09-14 ENCOUNTER — Other Ambulatory Visit: Payer: Self-pay

## 2015-09-14 ENCOUNTER — Encounter: Payer: Self-pay | Admitting: Gastroenterology

## 2015-09-14 DIAGNOSIS — Z1231 Encounter for screening mammogram for malignant neoplasm of breast: Secondary | ICD-10-CM

## 2015-09-15 ENCOUNTER — Other Ambulatory Visit (INDEPENDENT_AMBULATORY_CARE_PROVIDER_SITE_OTHER): Payer: Medicare Other

## 2015-09-15 ENCOUNTER — Other Ambulatory Visit: Payer: Self-pay | Admitting: *Deleted

## 2015-09-15 DIAGNOSIS — E890 Postprocedural hypothyroidism: Secondary | ICD-10-CM | POA: Diagnosis not present

## 2015-09-15 LAB — T4, FREE: Free T4: 1.26 ng/dL (ref 0.60–1.60)

## 2015-09-15 LAB — TSH: TSH: 2.55 u[IU]/mL (ref 0.35–4.50)

## 2015-09-15 LAB — CALCIUM: CALCIUM: 9.8 mg/dL (ref 8.4–10.5)

## 2015-09-15 MED ORDER — LEVOTHYROXINE SODIUM 112 MCG PO TABS: ORAL_TABLET | ORAL | Status: DC

## 2015-09-23 ENCOUNTER — Telehealth: Payer: Self-pay | Admitting: Internal Medicine

## 2015-09-23 NOTE — Telephone Encounter (Signed)
Called pt advised her per Dr Ronnie Derby note. Pt voiced understanding. Dr Cruzita Lederer please read message below and advise.

## 2015-09-23 NOTE — Telephone Encounter (Signed)
Usually not related to thyroid medication, she needs to discuss with [facility] tomorrow

## 2015-09-23 NOTE — Telephone Encounter (Signed)
Please read message below and advise in Dr Arman Filter absence. Thank you.

## 2015-09-23 NOTE — Telephone Encounter (Signed)
Pt is saying she is having some swelling in her legs and feet, she was concerned it had something to do with her thyroid medication

## 2015-09-24 NOTE — Telephone Encounter (Signed)
Called pt and advised her per Dr Arman Filter message. Pt stated she wore a knee brace all day on Tues and Wed b/c she has a bad knee. She feels this may have attributed to the feet swelling. Pt has no swelling today. She will keep an eye on it and contact her PCP if it happens again.

## 2015-09-24 NOTE — Telephone Encounter (Signed)
I agree. TFTs are normal. The leg swelling is not related to the thyroid meds.

## 2015-10-20 ENCOUNTER — Ambulatory Visit (AMBULATORY_SURGERY_CENTER): Payer: Self-pay

## 2015-10-20 VITALS — Ht 59.5 in | Wt 164.8 lb

## 2015-10-20 DIAGNOSIS — Z86010 Personal history of colonic polyps: Secondary | ICD-10-CM

## 2015-10-20 MED ORDER — NA SULFATE-K SULFATE-MG SULF 17.5-3.13-1.6 GM/177ML PO SOLN: ORAL | Status: DC

## 2015-10-20 NOTE — Progress Notes (Signed)
Per pt, no allergies to soy or egg products.Pt not taking any weight loss meds or using  O2 at home. 

## 2015-10-21 ENCOUNTER — Ambulatory Visit: Payer: Medicare Other

## 2015-11-03 ENCOUNTER — Encounter: Payer: Medicare Other | Admitting: Gastroenterology

## 2015-11-10 ENCOUNTER — Ambulatory Visit
Admission: RE | Admit: 2015-11-10 | Discharge: 2015-11-10 | Disposition: A | Discharge status: Home / Self Care | Payer: Medicare Other | Source: Ambulatory Visit

## 2015-11-10 DIAGNOSIS — Z1231 Encounter for screening mammogram for malignant neoplasm of breast: Secondary | ICD-10-CM

## 2015-11-24 ENCOUNTER — Encounter: Payer: Self-pay | Admitting: Gastroenterology

## 2015-12-01 ENCOUNTER — Telehealth: Payer: Self-pay | Admitting: Gastroenterology

## 2015-12-01 NOTE — Telephone Encounter (Signed)
Spoke with pt.She states she is unable to drink the Suprep bowel prep and is requesting the Miralax prep.Will mail new Miralax prep instructions to pt and fax instructions to pt at work also( per her request.)She will call if she has further questions. Gwyndolyn Saxon

## 2015-12-08 ENCOUNTER — Ambulatory Visit (AMBULATORY_SURGERY_CENTER): Payer: Medicare Other | Admitting: Gastroenterology

## 2015-12-08 ENCOUNTER — Encounter: Payer: Self-pay | Admitting: Gastroenterology

## 2015-12-08 VITALS — BP 150/58 | HR 74 | Temp 97.8°F | Resp 18 | Ht 59.5 in | Wt 164.0 lb

## 2015-12-08 DIAGNOSIS — Z86010 Personal history of colonic polyps: Secondary | ICD-10-CM | POA: Diagnosis not present

## 2015-12-08 DIAGNOSIS — K621 Rectal polyp: Secondary | ICD-10-CM

## 2015-12-08 DIAGNOSIS — D125 Benign neoplasm of sigmoid colon: Secondary | ICD-10-CM

## 2015-12-08 DIAGNOSIS — D129 Benign neoplasm of anus and anal canal: Secondary | ICD-10-CM

## 2015-12-08 DIAGNOSIS — D124 Benign neoplasm of descending colon: Secondary | ICD-10-CM

## 2015-12-08 DIAGNOSIS — D128 Benign neoplasm of rectum: Secondary | ICD-10-CM

## 2015-12-08 MED ORDER — SODIUM CHLORIDE 0.9 % IV SOLN: 500.0000 mL | INTRAVENOUS | Status: DC

## 2015-12-08 NOTE — Progress Notes (Signed)
No egg or soy allergy known to patient  No issues with past sedation with any surgeries or procedures, no intubation problems - after surgery 6 months ago at [facility] had nausea post op  No diet pills per patient No home 02 use per patient  No blood thinners per patient  Pt denies issues with constipation

## 2015-12-08 NOTE — Op Note (Signed)
[place] Patient Name: Vanessa Clements Procedure Date: 12/08/2015 11:32 AM MRN: [id number] Endoscopist: Ladene Artist , MD Age: 80 Referring MD:  Date of Birth: Jan 10, 1936 Gender: Female Procedure:                Colonoscopy Indications:              Surveillance: Personal history of adenomatous                            polyps on last colonoscopy > 5 years ago Medicines:                Monitored Anesthesia Care Procedure:                Pre-Anesthesia Assessment:                           - Prior to the procedure, a History and Physical                            was performed, and patient medications and                            allergies were reviewed. The patient's tolerance of                            previous anesthesia was also reviewed. The risks                            and benefits of the procedure and the sedation                            options and risks were discussed with the patient.                            All questions were answered, and informed consent                            was obtained. Prior Anticoagulants: The patient has                            taken no previous anticoagulant or antiplatelet                            agents. ASA Grade Assessment: II - A patient with                            mild systemic disease. After reviewing the risks                            and benefits, the patient was deemed in                            satisfactory condition to undergo the procedure.  After obtaining informed consent, the colonoscope                            was passed under direct vision. Throughout the                            procedure, the patient's blood pressure, pulse, and                            oxygen saturations were monitored continuously. The                            Model CF-HQ190L [id number]) scope was introduced                            through the anus and advanced to the  the cecum,                            identified by appendiceal orifice and ileocecal                            valve. The colonoscopy was performed without                            difficulty. The patient tolerated the procedure                            well. The quality of the bowel preparation was                            excellent. The ileocecal valve, appendiceal                            orifice, and rectum were photographed. Scope In: 11:41:29 AM Scope Out: 11:58:28 AM Scope Withdrawal Time: 0 hours 14 minutes 37 seconds  Total Procedure Duration: 0 hours 16 minutes 59 seconds  Findings:                 The digital rectal exam was normal.                           Two sessile polyps were found in the sigmoid colon                            and descending colon. The polyps were 6 to 7 mm in                            size. These polyps were removed with a cold snare.                            Resection and retrieval were complete.                           A 4 mm polyp was found in  the rectum. The polyp was                            sessile. The polyp was removed with a cold biopsy                            forceps. Resection and retrieval were complete.                           A few small-mouthed diverticula were found in the                            sigmoid colon and descending colon. There was no                            evidence of diverticular bleeding.                           The exam was otherwise normal throughout the                            examined colon.                           The retroflexed view of the distal rectum and anal                            verge was normal and showed no anal or rectal                            abnormalities. Complications:            No immediate complications. Estimated Blood Loss:     Estimated blood loss: none. Impression:               - Two 6 to 7 mm polyps in the sigmoid colon and in                             the descending colon, removed with a cold snare.                            Resected and retrieved.                           - One 4 mm polyp in the rectum, removed with a cold                            biopsy forceps. Resected and retrieved.                           - Mild diverticulosis in the sigmoid colon and in                            the descending colon. There was no evidence of  diverticular bleeding. Recommendation:           - Patient has a contact number available for                            emergencies. The signs and symptoms of potential                            delayed complications were discussed with the                            patient. Return to normal activities tomorrow.                            Written discharge instructions were provided to the                            patient.                           - High fiber diet.                           - Continue present medications.                           - Await pathology results.                           - No repeat colonoscopy due to age. Ladene Artist, MD 12/08/2015 12:03:36 PM This report has been signed electronically.

## 2015-12-08 NOTE — Patient Instructions (Signed)
YOU HAD AN ENDOSCOPIC PROCEDURE TODAY AT THE [facility]:   Refer to the procedure report that was given to you for any specific questions about what was found during the examination.  If the procedure report does not answer your questions, please call your gastroenterologist to clarify.  If you requested that your care partner not be given the details of your procedure findings, then the procedure report has been included in a sealed envelope for you to review at your convenience later.  YOU SHOULD EXPECT: Some feelings of bloating in the abdomen. Passage of more gas than usual.  Walking can help get rid of the air that was put into your GI tract during the procedure and reduce the bloating. If you had a lower endoscopy (such as a colonoscopy or flexible sigmoidoscopy) you may notice spotting of blood in your stool or on the toilet paper. If you underwent a bowel prep for your procedure, you may not have a normal bowel movement for a few days.  Please Note:  You might notice some irritation and congestion in your nose or some drainage.  This is from the oxygen used during your procedure.  There is no need for concern and it should clear up in a day or so.  SYMPTOMS TO REPORT IMMEDIATELY:   Following lower endoscopy (colonoscopy or flexible sigmoidoscopy):  Excessive amounts of blood in the stool  Significant tenderness or worsening of abdominal pains  Swelling of the abdomen that is new, acute  Fever of 100F or higher   For urgent or emergent issues, a gastroenterologist can be reached at any hour by calling [phone].   DIET: Your first meal following the procedure should be a small meal and then it is ok to progress to your normal diet. Heavy or fried foods are harder to digest and may make you feel nauseous or bloated.  Likewise, meals heavy in dairy and vegetables can increase bloating.  Drink plenty of fluids but you should avoid alcoholic beverages for 24 hours. Try to  increase the fiber in your diet, and drink plenty of water.  ACTIVITY:  You should plan to take it easy for the rest of today and you should NOT DRIVE or use heavy machinery until tomorrow (because of the sedation medicines used during the test).    FOLLOW UP: Our staff will call the number listed on your records the next business day following your procedure to check on you and address any questions or concerns that you may have regarding the information given to you following your procedure. If we do not reach you, we will leave a message.  However, if you are feeling well and you are not experiencing any problems, there is no need to return our call.  We will assume that you have returned to your regular daily activities without incident.  If any biopsies were taken you will be contacted by phone or by letter within the next 1-3 weeks.  Please call us at [phone] if you have not heard about the biopsies in 3 weeks.    SIGNATURES/CONFIDENTIALITY: You and/or your care partner have signed paperwork which will be entered into your electronic medical record.  These signatures attest to the fact that that the information above on your After Visit Summary has been reviewed and is understood.  Full responsibility of the confidentiality of this discharge information lies with you and/or your care-partner.  Read all of the handouts given to you by your recovery   room nurse.  Thank-you for choosing us for your healthcare needs today. 

## 2015-12-08 NOTE — Progress Notes (Signed)
Report to PACU, RN, vss, BBS= Clear.  

## 2015-12-08 NOTE — Progress Notes (Signed)
Called to room to assist during endoscopic procedure.  Patient ID and intended procedure confirmed with present staff. Received instructions for my participation in the procedure from the performing physician.  

## 2015-12-09 ENCOUNTER — Telehealth: Payer: Self-pay | Admitting: *Deleted

## 2015-12-09 NOTE — Telephone Encounter (Signed)
  Follow up Call-  Call back number 12/08/2015  Post procedure Call Back phone  # [phone]  Permission to leave phone message Yes     Patient questions:  Do you have a fever, pain , or abdominal swelling? No. Pain Score  0 *  Have you tolerated food without any problems? Yes.    Have you been able to return to your normal activities? Yes.    Do you have any questions about your discharge instructions: Diet   No. Medications  No. Follow up visit  No.  Do you have questions or concerns about your Care? No.  Actions: * If pain score is 4 or above: No action needed, pain <4.

## 2015-12-14 ENCOUNTER — Encounter: Payer: Self-pay | Admitting: Gastroenterology

## 2015-12-17 ENCOUNTER — Telehealth: Payer: Self-pay | Admitting: Gastroenterology

## 2015-12-17 NOTE — Telephone Encounter (Signed)
Discussed with pt that due to her age she would not need another recall colon. Let pt know that if she has any other issues or problems to contact the office.

## 2016-02-01 ENCOUNTER — Encounter: Payer: Self-pay | Admitting: Internal Medicine

## 2016-02-01 ENCOUNTER — Ambulatory Visit (INDEPENDENT_AMBULATORY_CARE_PROVIDER_SITE_OTHER): Payer: Medicare Other | Admitting: Internal Medicine

## 2016-02-01 VITALS — BP 148/84 | HR 74 | Wt 168.0 lb

## 2016-02-01 DIAGNOSIS — E890 Postprocedural hypothyroidism: Secondary | ICD-10-CM | POA: Diagnosis not present

## 2016-02-01 LAB — TSH: TSH: 3.12 u[IU]/mL (ref 0.35–4.50)

## 2016-02-01 LAB — T4, FREE: FREE T4: 1.21 ng/dL (ref 0.60–1.60)

## 2016-02-01 NOTE — Patient Instructions (Signed)
Please reduce the calcium to 600 mg at night.  Please continue Levothyroxine 112 mcg daily.  Take the thyroid hormone every day, with water, at least 30 minutes before breakfast, separated by at least 4 hours from: - acid reflux medications - calcium - iron - multivitamins  Please come back for a follow-up appointment in 6 months.

## 2016-02-01 NOTE — Progress Notes (Signed)
Patient ID: Vanessa Clements, female   DOB: 1935-11-29, 80 y.o.   MRN: [id number]   HPI  Vanessa Clements is a 80 y.o.-year-old female, initially referred by her ENT dr, Dr Dagmar Hait, now returning for f/u for postsurgical hypothyroidism after total thyroidectomy for MNG. Last visit 6 mo ago.  Pt has a h/o MNG, with 2x FLUS biopsies >> thyroidectomy >> benign pathology.  She had total thyroidectomy at [facility] (Dr Eliezer Champagne) on 06/04/2015 - had a great experience! She feels well after the surgery, has plenty of energy and no complaints.  I reviewed pt's thyroid tests: Lab Results  Component Value Date   TSH 2.55 09/15/2015   TSH 3.36 07/16/2015   TSH 0.927 02/27/2015   FREET4 1.26 09/15/2015   FREET4 1.31 07/16/2015  04/2014: TSH normal by Dr. Ignacia Bayley (ENT)  She is now on Levothyroxine 112 mcg daily: - in am - with water - 1h before b'fast - calcium 1200 mg at night  - vit D 2000 units at night  She had transient hypocalcemia after the surgery, which has resolved. She is continuing with 600 mg x2 tablets of calcium at night. No numbness, muscle cramps.  Pt denies hoarseness, dysphagia/odynophagia, dysesthesia at the cervical scar site.  Pt does have a FH of thyroid ds.: GM with goiter, mother with mild hypothyroidism. No FH of thyroid cancer. No h/o radiation tx to head or neck.  ROS: Constitutional: no weight gain/loss, no fatigue, no subjective hyperthermia/hypothermia Eyes: no blurry vision, no xerophthalmia ENT: no sore throat, no nodules palpated in throat, no dysphagia/odynophagia, no hoarseness Cardiovascular: no CP/SOB/palpitations/leg swelling Respiratory: no cough/SOB Gastrointestinal: no N/V/D/C Musculoskeletal: no muscle/+ joint aches Skin: no rash Neurological: no tremors/numbness/tingling/dizziness  I reviewed pt's medications, allergies, PMH, social hx, family hx, and changes were documented in the history of present illness. Otherwise, unchanged from  my initial visit note.  Past Medical History:  Diagnosis Date  . Adenomatous polyp of colon 2008  . Arthritis    hands & knees  . Atrophic vaginitis    in past  . Lichen sclerosus    Per pt, denies this   . Post-operative nausea and vomiting    no vomiting   Past Surgical History:  Procedure Laterality Date  . THYROIDECTOMY  06/04/15   At [facility]  . TONSILLECTOMY AND ADENOIDECTOMY  childhood   Social History   Social History  . Marital status: Married    Spouse name: N/A  . Number of children: 2  . Years of education: N/A   Occupational History  . Not on file.   Social History Main Topics  . Smoking status: Never Smoker  . Smokeless tobacco: Never Used  . Alcohol use No  . Drug use: No  . Sexual activity: No     Comment: husband vasectomy   Other Topics Concern  . Not on file   Social History Narrative  . No narrative on file   Current Outpatient Prescriptions on File Prior to Visit  Medication Sig Dispense Refill  . bisacodyl (DULCOLAX) 5 MG EC tablet Take 5 mg by mouth once. X 4 for colon 6-6    . Calcium Carb-Cholecalciferol (CALCIUM + D3 PO) Take 1,600 Units by mouth. Take 2 daily    . cetirizine (ZYRTEC) 10 MG tablet Take 10 mg by mouth as needed.     . cholecalciferol (VITAMIN D) 1000 UNITS tablet Take 2,000 Units by mouth daily. Vit d 3 1000 units-Take 2 daily    .  levothyroxine (SYNTHROID, LEVOTHROID) 112 MCG tablet TAKE 1 TAB EVERY MORNING 30-60 MINUTES BEFORE BREAKFAST ON EMPTY STOMACH WITH GLASS OF WATER 90 tablet 1  . lisinopril (PRINIVIL,ZESTRIL) 10 MG tablet Take 10 mg by mouth daily.    . polyethylene glycol powder (MIRALAX) powder Take 1 Container by mouth once. For colon 6-6     No current facility-administered medications on file prior to visit.    Allergies  Allergen Reactions  . Codeine Nausea And Vomiting    fainting  . Neomycin-Bacitracin Zn-Polymyx Other (See Comments)    Blisters skin  . Sulfa Antibiotics     Caused nausea    Family History  Problem Relation Age of Onset  . Thyroid disease Mother   . Osteoarthritis Mother   . Cancer Father     prostate & spread to lungs  . Thyroid disease Maternal Grandmother     goiter   PE: BP (!) 148/84 (BP Location: Left Arm, Patient Position: Sitting)   Pulse 74   Wt 168 lb (76.2 kg)   LMP 07/04/1998   SpO2 97%   BMI 33.36 kg/m  Wt Readings from Last 3 Encounters:  02/01/16 168 lb (76.2 kg)  12/08/15 164 lb (74.4 kg)  10/20/15 164 lb 12.8 oz (74.8 kg)   Constitutional: overweight, in NAD Eyes: PERRLA, EOMI, no exophthalmos ENT: moist mucous membranes, thyroidectomy scar slightly erythematous, nonpainful, w/o swelling , no cervical lymphadenopathy Cardiovascular: RRR, No MRG Respiratory: CTA B Gastrointestinal: abdomen soft, NT, ND, BS+ Musculoskeletal: no deformities, strength intact in all 4;  Skin: moist, warm, no rashes Neurological: no tremor with outstretched hands, DTR normal in all 4  ASSESSMENT: 1. H/o MNG - FLUS x 2 in RLL thyroid nodule - now s/p total thyroidectomy at [facility]  2. Postsurgical hypothyroidism  PLAN: 1.  - Pt with a h/o multinodular goiter, with one nodule biopsy beeing inconclusive 2x.  I recommended surgery especially to avoid a reintervention in case se turned out to have cancer >> she had the surgery 2 months ago and the pathology was benign. She is not having any complaints about her thyroidectomy scar.  2. Postsurgical hypothyroidism - on LT4 112 mcg, with normal TFTs 4 mo ago - we discussed about taking the thyroid hormone every day, with water, at least 30 minutes before breakfast, separated by at least 4 hours from: - acid reflux medications - calcium - iron - multivitamins She is taking it correctly - will recheck TFTs - RTC 6 mo  3. Postsurgical hypocalcemia - resolved, per [facility] records - will decrease Calcium from 1200 to 600 mg daily    Needs refills.  Office Visit on 02/01/2016  Component Date Value  Ref Range Status  . Free T4 02/01/2016 1.21  0.60 - 1.60 ng/dL Final  . TSH 02/01/2016 3.12  0.35 - 4.50 uIU/mL Final   Normal labs. Will continue the current dose of levothyroxine.  Philemon Kingdom, MD PhD [facility]

## 2016-02-02 ENCOUNTER — Telehealth: Payer: Self-pay

## 2016-02-02 MED ORDER — LEVOTHYROXINE SODIUM 112 MCG PO TABS: ORAL_TABLET | ORAL | 3 refills | Status: DC

## 2016-02-02 NOTE — Telephone Encounter (Signed)
Called and left message that lab results were normal. Advised patient to call back if any questions or concerns.

## 2016-03-19 ENCOUNTER — Other Ambulatory Visit: Payer: Self-pay | Admitting: Internal Medicine

## 2016-03-21 ENCOUNTER — Other Ambulatory Visit: Payer: Self-pay

## 2016-03-22 ENCOUNTER — Other Ambulatory Visit: Payer: Self-pay | Admitting: Internal Medicine

## 2016-06-06 ENCOUNTER — Encounter: Payer: Self-pay | Admitting: Sports Medicine

## 2016-06-06 ENCOUNTER — Ambulatory Visit (INDEPENDENT_AMBULATORY_CARE_PROVIDER_SITE_OTHER): Payer: Medicare Other | Admitting: Sports Medicine

## 2016-06-06 VITALS — BP 176/62 | Ht 59.0 in | Wt 160.0 lb

## 2016-06-06 DIAGNOSIS — M170 Bilateral primary osteoarthritis of knee: Secondary | ICD-10-CM

## 2016-06-06 MED ORDER — METHYLPREDNISOLONE ACETATE 40 MG/ML IJ SUSP
40.0000 mg | Freq: Once | INTRAMUSCULAR | Status: AC
  Administered 2016-06-06: 40 mg via INTRA_ARTICULAR

## 2016-06-06 NOTE — Progress Notes (Signed)
   Subjective:    Patient ID: Vanessa Clements, female    DOB: 03-06-1936, 80 y.o.   MRN: [id number]  HPI chief complaint: Bilateral knee pain  Patient comes in today complaining of returning bilateral knee pain due to severe DJD. She was last seen in the office 2-1/2 years ago. Cortisone injections were administered which has helped her up until recently. Pain has now returned. It is identical in nature to what she has experienced previously. She describes a toothache type of pain that is worse with activity. Resolves at rest. She denies any recent trauma. She does take 200 mg of Motrin twice a day which does seem to help. She has also tried Tylenol but that does not seem to help as much. She denies any swelling.  Interim medical history reviewed Interim surgery history reviewed Medications reviewed Allergies reviewed    Review of Systems    as above Objective:   Physical Exam  Well-developed, well-nourished. No acute distress. Vital signs reviewed.  Examination of each knee shows range of motion from 0-100. No effusion. She has moderate varus deformity bilaterally. No tenderness to palpation. Good ligamentous stability. 1-2+ patellofemoral crepitus bilaterally. Neurovascularly intact distally. Ambulating with the assistance of a cane.      Assessment & Plan:   Returning bilateral knee pain secondary to severe DJD  Patient is not interested in total knee arthroplasty. Cortisone injections seem to help her for several months. It has been 2-1/2 years since I injected her knees. Therefore, I will go ahead and reinject each knee today. An anterior lateral approach was utilized. Patient tolerates this without difficulty. Follow-up as needed.  Consent obtained and verified. Time-out conducted. Noted no overlying erythema, induration, or other signs of local infection. Skin prepped in a sterile fashion. Topical analgesic spray: Ethyl chloride. Joint: left knee, anterior lateral  approach Needle: 22g 1.5 inch Completed without difficulty. Meds: 3cc 1% xylocaine, 1cc (40mg ) depomedrol  Advised to call if fevers/chills, erythema, induration, drainage, or persistent bleeding.  Consent obtained and verified. Time-out conducted. Noted no overlying erythema, induration, or other signs of local infection. Skin prepped in a sterile fashion. Topical analgesic spray: Ethyl chloride. Joint: right knee, anterior lateral approach Needle: 22g 1.5 inch Completed without difficulty. Meds: 3cc 1% xylocaine, 1cc (40mg ) depomedrol  Advised to call if fevers/chills, erythema, induration, drainage, or persistent bleeding.

## 2016-07-08 ENCOUNTER — Telehealth: Payer: Self-pay

## 2016-07-08 ENCOUNTER — Telehealth: Payer: Self-pay | Admitting: Internal Medicine

## 2016-07-08 NOTE — Telephone Encounter (Signed)
She can just take it tomorrow. No problem.

## 2016-07-08 NOTE — Telephone Encounter (Signed)
P 

## 2016-07-08 NOTE — Telephone Encounter (Signed)
Take it 30 min before lunch today, if possible, if not take it tomorrow

## 2016-07-08 NOTE — Telephone Encounter (Signed)
Called and notified patient not to take medicine today that okay to take tomorrow per Dr.Gherghe. No questions at this time.

## 2016-07-08 NOTE — Telephone Encounter (Signed)
Patient stated she forgot to take her medication levothyroxine,  What should she do ?? Take it tomorrow? Please advise

## 2016-07-08 NOTE — Telephone Encounter (Signed)
Called and advised patient. No questions.

## 2016-07-08 NOTE — Telephone Encounter (Signed)
Note placed. Waiting for MD response.

## 2016-08-02 ENCOUNTER — Encounter: Payer: Self-pay | Admitting: Internal Medicine

## 2016-08-02 ENCOUNTER — Ambulatory Visit (INDEPENDENT_AMBULATORY_CARE_PROVIDER_SITE_OTHER): Payer: Medicare Other | Admitting: Internal Medicine

## 2016-08-02 VITALS — BP 140/84 | HR 93 | Wt 170.0 lb

## 2016-08-02 DIAGNOSIS — E890 Postprocedural hypothyroidism: Secondary | ICD-10-CM | POA: Diagnosis not present

## 2016-08-02 LAB — TSH: TSH: 1.66 u[IU]/mL (ref 0.35–4.50)

## 2016-08-02 LAB — T4, FREE: Free T4: 1.39 ng/dL (ref 0.60–1.60)

## 2016-08-02 NOTE — Patient Instructions (Addendum)
Please stop at the lab.  Please stay on the Levothyroxine 112 mcg.  Take the thyroid hormone every day, with water, at least 30 minutes before breakfast, separated by at least 4 hours from: - acid reflux medications - calcium - iron - multivitamins  Please return in 6 months. 

## 2016-08-02 NOTE — Progress Notes (Signed)
Patient ID: Vanessa Clements, female   DOB: 20-Mar-1936, 81 y.o.   MRN: [id number]   HPI  Vanessa Clements is a 81 y.o.-year-old female, initially referred by her ENT dr, Dr Dagmar Hait, now returning for f/u for postsurgical hypothyroidism after total thyroidectomy for MNG. Last visit 6 mo ago.  Since last visit >> she lost and then gained back ~10 lbs. She had a steroid inj 2 mo ago.  She has a rash on shins >> changed Lisinopril to Losartan-HCT, but still has the rash. She wonders if this is from LT4.  Pt has a h/o MNG, with 2x FLUS biopsies >> thyroidectomy >> benign pathology. She had total thyroidectomy at [facility] (Dr Eliezer Champagne) on 06/04/2015 - had a great experience! She feels well after the surgery, w/o fatigue.   I reviewed pt's thyroid tests: Lab Results  Component Value Date   TSH 3.12 02/01/2016   TSH 2.55 09/15/2015   TSH 3.36 07/16/2015   TSH 0.927 02/27/2015   FREET4 1.21 02/01/2016   FREET4 1.26 09/15/2015   FREET4 1.31 07/16/2015  04/2014: TSH normal by Dr. Ignacia Bayley (ENT)  She is now on Levothyroxine 112 mcg daily: - in am, at 4 am - with water - 3h before b'fast - calcium 1200 mg at bedtime >> now 600 mg Ca - minipills - vit D 2000 units at night >> now 1000 units  She had transient hypocalcemia after the surgery, which has resolved: Lab Results  Component Value Date   CALCIUM 9.8 09/15/2015   Pt denies hoarseness, dysphagia/odynophagia, dysesthesia at the cervical scar site.  Pt does have a FH of thyroid ds.: GM with goiter, mother with mild hypothyroidism. No FH of thyroid cancer. No h/o radiation tx to head or neck.  ROS: Constitutional: + weight gain no fatigue, no subjective hyperthermia/hypothermia Eyes: no blurry vision, no xerophthalmia ENT: no sore throat, no nodules palpated in throat, no dysphagia/odynophagia, no hoarseness Cardiovascular: no CP/SOB/palpitations/leg swelling Respiratory: no cough/SOB Gastrointestinal: no  N/V/D/C Musculoskeletal: no muscle/+ joint aches Skin: + rash - on B shins and dorsum of foot Neurological: no tremors/numbness/tingling/dizziness  I reviewed pt's medications, allergies, PMH, social hx, family hx, and changes were documented in the history of present illness. Otherwise, unchanged from my initial visit note.  Past Medical History:  Diagnosis Date  . Adenomatous polyp of colon 2008  . Arthritis    hands & knees  . Atrophic vaginitis    in past  . Lichen sclerosus    Per pt, denies this   . Post-operative nausea and vomiting    no vomiting   Past Surgical History:  Procedure Laterality Date  . THYROIDECTOMY  06/04/15   At [facility]  . TONSILLECTOMY AND ADENOIDECTOMY  childhood   Social History   Social History  . Marital status: Married    Spouse name: N/A  . Number of children: 2  . Years of education: N/A   Occupational History  . Not on file.   Social History Main Topics  . Smoking status: Never Smoker  . Smokeless tobacco: Never Used  . Alcohol use No  . Drug use: No  . Sexual activity: No     Comment: husband vasectomy   Other Topics Concern  . Not on file   Social History Narrative  . No narrative on file   Current Outpatient Prescriptions on File Prior to Visit  Medication Sig Dispense Refill  . Calcium Carb-Cholecalciferol (CALCIUM + D3 PO) Take 1,600 Units by mouth. Take  2 daily    . cetirizine (ZYRTEC) 10 MG tablet Take 10 mg by mouth as needed.     . cholecalciferol (VITAMIN D) 1000 UNITS tablet Take 1,000 Units by mouth daily. Vit d 3 1000 units-Take 2 daily    . levothyroxine (SYNTHROID, LEVOTHROID) 112 MCG tablet TAKE 1 TAB EVERY MORNING 30-60 MINUTES BEFORE BREAKFAST ON EMPTY STOMACH WITH GLASS OF WATER 90 tablet 3  . lisinopril (PRINIVIL,ZESTRIL) 10 MG tablet Take 10 mg by mouth daily.    Marland Kitchen losartan-hydrochlorothiazide (HYZAAR) 50-12.5 MG tablet     . erythromycin ophthalmic ointment      No current facility-administered  medications on file prior to visit.    Allergies  Allergen Reactions  . Codeine Nausea And Vomiting    fainting  . Neomycin-Bacitracin Zn-Polymyx Other (See Comments)    Blisters skin  . Sulfa Antibiotics     Caused nausea   Family History  Problem Relation Age of Onset  . Thyroid disease Mother   . Osteoarthritis Mother   . Cancer Father     prostate & spread to lungs  . Thyroid disease Maternal Grandmother     goiter   PE: BP 140/84 (BP Location: Left Arm, Patient Position: Sitting)   Pulse 93   Wt 170 lb (77.1 kg)   LMP 07/04/1998   SpO2 98%   BMI 34.34 kg/m  Wt Readings from Last 3 Encounters:  08/02/16 170 lb (77.1 kg)  06/06/16 160 lb (72.6 kg)  02/01/16 168 lb (76.2 kg)   Constitutional: overweight, in NAD Eyes: PERRLA, EOMI, no exophthalmos ENT: moist mucous membranes, thyroidectomy scar healed,  no cervical lymphadenopathy Cardiovascular: RRR, No MRG Respiratory: CTA B Gastrointestinal: abdomen soft, NT, ND, BS+ Musculoskeletal: no deformities, strength intact in all 4;  Skin: moist, warm,+ rash - on B shins and dorsum of foot Neurological: no tremor with outstretched hands, DTR normal in all 4  ASSESSMENT: 1. H/o MNG - FLUS x 2 in RLL thyroid nodule - now s/p total thyroidectomy at [facility]  2. Postsurgical hypothyroidism  PLAN: 1.  - Pt with a h/o multinodular goiter, with one nodule biopsy beeing inconclusive 2x.  I recommended surgery especially to avoid a reintervention in case se turned out to have cancer >> she had the surgery in 06/2015  and the pathology was benign.  - her thyroidectomy scar is completely healed  2. Postsurgical hypothyroidism - on LT4 112 mcg, with normal TFTs at last check. - She complains about having a rash on bilateral shins and dorsum of feet. She already changed her blood pressure medicine, but the rash persisted. She is wondering whether this could be related to the levothyroxine. We decided to check her thyroid tests  today and try to replace her dose with LT4 50 g tablets (e.g. 2.5 tablets for a dose of 125, if we have room to increase the dose). Alternatively, we can try to switch to brand name Synthroid. For now, she opted for trying to generic 50 g tablets. - we discussed about taking the thyroid hormone every day, with water, at least 30 minutes before breakfast, separated by at least 4 hours from: - acid reflux medications - calcium - iron - multivitamins She is taking it correctly, however, she takes calcium at that time and levothyroxine at 4 AM. I advised her to move calcium with dinner since it is better absorbed was taken with food but also to space it more from levothyroxine. - RTC 6 mo  Component  Latest Ref Rng & Units 08/02/2016  TSH     0.35 - 4.50 uIU/mL 1.66  T4,Free(Direct)     0.60 - 1.60 ng/dL 1.39   TFTs are great. We can try 50 mcg  X 2 + 1/4 of another tablet per day - try this for 2-3 weeks. If rash not better, either switch back to LT4 or to Synthroid DAW 112.  Philemon Kingdom, MD PhD [facility] Endocrinology

## 2016-08-04 ENCOUNTER — Other Ambulatory Visit: Payer: Self-pay

## 2016-08-04 ENCOUNTER — Telehealth: Payer: Self-pay

## 2016-08-04 MED ORDER — LEVOTHYROXINE SODIUM 50 MCG PO TABS: ORAL_TABLET | ORAL | 1 refills | Status: DC

## 2016-08-04 NOTE — Telephone Encounter (Signed)
Called and notified patient of lab results, and medication changes. Advised her to try the no dye synthroid to see if it would help with her rash in 2-3 weeks. Patient had no questions and understood.

## 2016-08-04 NOTE — Telephone Encounter (Signed)
-----   Message from Philemon Kingdom, MD sent at 08/03/2016  1:10 PM EST ----- Almyra Free, can you please call pt: Her thyroid tests are great. Please check with her whether she wants to switch to 50 g levothyroxine tablets, as we discussed at the time of the visit, to avoid any dyes in the tablet. The problem is that she will need to take 2 tablets and a quarter every day (to make 112 g per day). I would suggest to try this for 2-3 weeks to see if her rash improves. If it does not improve, we can continue with either levothyroxine 112 g daily or Synthroid brand name 112 g daily. Please let us know. If she does want to start with the 50 g tablet, please send this to the pharmacy.

## 2016-08-04 NOTE — Telephone Encounter (Signed)
Patient is calling for results of labs °

## 2016-08-04 NOTE — Telephone Encounter (Signed)
Returned phone call. LVM, gave call back number.

## 2016-08-04 NOTE — Telephone Encounter (Signed)
Called, LVM to return call for lab results, and medication changes.

## 2016-08-04 NOTE — Telephone Encounter (Signed)
Called and left message regarding lab results. Gave call back number.

## 2016-08-18 ENCOUNTER — Telehealth: Payer: Self-pay | Admitting: Internal Medicine

## 2016-08-18 ENCOUNTER — Telehealth: Payer: Self-pay

## 2016-08-18 NOTE — Telephone Encounter (Signed)
Patient ask if you called her? Please advise

## 2016-08-18 NOTE — Telephone Encounter (Signed)
Called patient back. She thought we had called but it was wrong number. Patient had no other questions at this time.

## 2016-09-02 ENCOUNTER — Encounter: Payer: Self-pay | Admitting: *Deleted

## 2016-09-02 NOTE — Patient Instructions (Signed)
Dr Latanya Maudlin or Mitzi Hansen, the PA [facility] 09/05/16 at [place]   [phone]

## 2016-09-10 ENCOUNTER — Other Ambulatory Visit: Payer: Self-pay | Admitting: Internal Medicine

## 2016-10-21 ENCOUNTER — Telehealth: Payer: Self-pay | Admitting: Sports Medicine

## 2016-10-21 NOTE — Telephone Encounter (Signed)
[facility] Sherren Mocha Early Tuesday 12/20/16 at 1230p  Pt will be placed on a waiting list for earlier openings They will call patient with appt time and date  [address], [place], [place] [address] Phone: [phone]

## 2016-10-21 NOTE — Telephone Encounter (Signed)
I received a phone call from the patient today claiming that she has a new ulcer on her lower leg. A friend of hers that is a nurse thinks that it is from poor circulation in her legs. She has not been evaluated for vascular insufficiency in the past. Therefore, I will refer her to vascular surgery for workup. In the meantime, the nurse friend has experience in wound care and is currently treating her with topical mupirocin. I explained to Vanessa Clements that if the wound gets worse before she is able to see vascular surgery then she needs to see her primary care physician. She was asking about the possibility of cortisone being administered with her Visco supplementation and whether or not that may be the culprit. I explained to her that cortisone is not administered with Visco supplementation so that is not the etiology of her symptoms.

## 2016-10-24 ENCOUNTER — Other Ambulatory Visit: Payer: Self-pay | Admitting: Vascular Surgery

## 2016-10-24 DIAGNOSIS — L98499 Non-pressure chronic ulcer of skin of other sites with unspecified severity: Secondary | ICD-10-CM

## 2016-10-31 ENCOUNTER — Other Ambulatory Visit: Payer: Self-pay | Admitting: Family Medicine

## 2016-10-31 DIAGNOSIS — Z1231 Encounter for screening mammogram for malignant neoplasm of breast: Secondary | ICD-10-CM

## 2016-12-13 ENCOUNTER — Ambulatory Visit
Admission: RE | Admit: 2016-12-13 | Discharge: 2016-12-13 | Disposition: A | Discharge status: Home / Self Care | Payer: Medicare Other | Source: Ambulatory Visit | Attending: Family Medicine | Admitting: Family Medicine

## 2016-12-13 DIAGNOSIS — Z1231 Encounter for screening mammogram for malignant neoplasm of breast: Secondary | ICD-10-CM

## 2016-12-20 ENCOUNTER — Encounter: Payer: Medicare Other | Admitting: Vascular Surgery

## 2016-12-20 ENCOUNTER — Encounter (HOSPITAL_COMMUNITY): Payer: Medicare Other

## 2016-12-21 ENCOUNTER — Other Ambulatory Visit: Payer: Self-pay | Admitting: Internal Medicine

## 2017-01-10 ENCOUNTER — Ambulatory Visit (INDEPENDENT_AMBULATORY_CARE_PROVIDER_SITE_OTHER): Payer: Medicare Other | Admitting: Internal Medicine

## 2017-01-10 ENCOUNTER — Telehealth: Payer: Self-pay

## 2017-01-10 ENCOUNTER — Encounter: Payer: Self-pay | Admitting: Internal Medicine

## 2017-01-10 VITALS — BP 130/84 | HR 98 | Wt 166.0 lb

## 2017-01-10 DIAGNOSIS — E890 Postprocedural hypothyroidism: Secondary | ICD-10-CM | POA: Diagnosis not present

## 2017-01-10 LAB — T4, FREE: FREE T4: 1.09 ng/dL (ref 0.60–1.60)

## 2017-01-10 LAB — TSH: TSH: 4.75 u[IU]/mL — AB (ref 0.35–4.50)

## 2017-01-10 NOTE — Telephone Encounter (Signed)
-----   Message from Philemon Kingdom, MD sent at 01/10/2017  4:55 PM EDT ----- Almyra Free, can you please call pt: Patient's TSH is a little bit high. I think it would be safer to go back to the 112 g daily pill. Alternatively, she can alternate 2x 50 mcg tablets + 1/2 tab with 2x 50 mg tablets + 1/4 tab every other day. We'll need another set of labs in 2 months. I will order them.

## 2017-01-10 NOTE — Telephone Encounter (Signed)
Patient called back. Please call when you can

## 2017-01-10 NOTE — Patient Instructions (Signed)
Please stop at the lab.  Please stay on the Levothyroxine 112 mcg.  Take the thyroid hormone every day, with water, at least 30 minutes before breakfast, separated by at least 4 hours from: - acid reflux medications - calcium - iron - multivitamins  Please return in 6 months.

## 2017-01-10 NOTE — Telephone Encounter (Signed)
Called patient and LVM advising her to call back for lab results and medication changes.

## 2017-01-10 NOTE — Progress Notes (Signed)
Patient ID: Vanessa Clements, female   DOB: May 15, 1936, 81 y.o.   MRN: [id number]   HPI  Vanessa Clements is a 81 y.o.-year-old female, initially referred by her ENT dr, Dr Dagmar Hait, now returning for f/u for postsurgical hypothyroidism after total thyroidectomy for MNG. Last visit 6 months ago.  Reviewed history: Pt has a h/o MNG, with 2x FLUS biopsies >> thyroidectomy >> benign pathology. She had total thyroidectomy at [facility] (Dr Eliezer Champagne) on 06/04/2015 - had a great experience! She feels well after the surgery, w/o fatigue.   However, at last visit, she had the rash, and we had to switch to 50 g levothyroxine tablets since they did not contain coloring agents or other aches and pains. She is currently taking 50 g 2 tablets +1/4 tablet. Rash is now only present on feet, but she still has it. No itching.  I reviewed pt's thyroid tests: Lab Results  Component Value Date   TSH 1.66 08/02/2016   TSH 3.12 02/01/2016   TSH 2.55 09/15/2015   TSH 3.36 07/16/2015   TSH 0.927 02/27/2015   FREET4 1.39 08/02/2016   FREET4 1.21 02/01/2016   FREET4 1.26 09/15/2015   FREET4 1.31 07/16/2015  04/2014: TSH normal by Dr. Ignacia Bayley (ENT)  She is now on Levothyroxine112 g daily (see above) - at 4 AM - with water - separated by ~3 hours from breakfast - calcium at bedtime - no biotin   She had transient hypocalcemia after the surgery, which has resolved: Lab Results  Component Value Date   CALCIUM 9.8 09/15/2015   Pt denies: - feeling nodules in neck - hoarseness - dysphagia - choking - SOB with lying down  Pt does have a FH of thyroid ds.: GM with goiter, mother with mild hypothyroidism. No FH of thyroid cancer. No h/o radiation tx to head or neck.  No seaweed or kelp. No recent contrast studies. No herbal supplements. No Biotin use. No recent steroids use.   ROS: Constitutional: + weight gain and loss (she is cutting portions), no fatigue, no subjective hyperthermia, no  subjective hypothermia Eyes: no blurry vision, no xerophthalmia ENT: no sore throat, no nodules palpated in throat, no dysphagia, no odynophagia, no hoarseness Cardiovascular: no CP/no SOB/no palpitations/no leg swelling Respiratory: no cough/no SOB/no wheezing Gastrointestinal: no N/no V/no D/no C/no acid reflux Musculoskeletal: no muscle aches/+ joint aches - knees - gets gel inj's Skin: no rashes, no hair loss Neurological: no tremors/no numbness/no tingling/no dizziness  I reviewed pt's medications, allergies, PMH, social hx, family hx, and changes were documented in the history of present illness. Otherwise, unchanged from my initial visit note.  Past Medical History:  Diagnosis Date  . Adenomatous polyp of colon 2008  . Arthritis    hands & knees  . Atrophic vaginitis    in past  . Lichen sclerosus    Per pt, denies this   . Post-operative nausea and vomiting    no vomiting   Past Surgical History:  Procedure Laterality Date  . THYROIDECTOMY  06/04/15   At [facility]  . TONSILLECTOMY AND ADENOIDECTOMY  childhood   Social History   Social History  . Marital status: Married    Spouse name: N/A  . Number of children: 2  . Years of education: N/A   Occupational History  . Not on file.   Social History Main Topics  . Smoking status: Never Smoker  . Smokeless tobacco: Never Used  . Alcohol use No  . Drug use: No  .  Sexual activity: No     Comment: husband vasectomy   Other Topics Concern  . Not on file   Social History Narrative  . No narrative on file   Current Outpatient Prescriptions on File Prior to Visit  Medication Sig Dispense Refill  . cetirizine (ZYRTEC) 10 MG tablet Take 10 mg by mouth as needed.     . cholecalciferol (VITAMIN D) 1000 UNITS tablet Take 1,000 Units by mouth daily. Vit d 3 1000 units-Take 2 daily    . erythromycin ophthalmic ointment     . levothyroxine (SYNTHROID, LEVOTHROID) 50 MCG tablet TAKE 2 AND 1/4 TABLETS BY MOUTH DAILY 180 tablet  1  . lisinopril (PRINIVIL,ZESTRIL) 10 MG tablet Take 10 mg by mouth daily.    Marland Kitchen losartan-hydrochlorothiazide (HYZAAR) 50-12.5 MG tablet      No current facility-administered medications on file prior to visit.    Allergies  Allergen Reactions  . Codeine Nausea And Vomiting    fainting  . Neomycin-Bacitracin Zn-Polymyx Other (See Comments)    Blisters skin  . Sulfa Antibiotics     Caused nausea   Family History  Problem Relation Age of Onset  . Thyroid disease Mother   . Osteoarthritis Mother   . Cancer Father        prostate & spread to lungs  . Thyroid disease Maternal Grandmother        goiter   PE: BP 130/84 (BP Location: Left Arm, Patient Position: Sitting)   Pulse 98   Wt 166 lb (75.3 kg)   LMP 07/04/1998   SpO2 96%   BMI 33.53 kg/m  Wt Readings from Last 3 Encounters:  01/10/17 166 lb (75.3 kg)  08/02/16 170 lb (77.1 kg)  06/06/16 160 lb (72.6 kg)   Constitutional: overweight, in NAD, walks with cane Eyes: PERRLA, EOMI, no exophthalmos ENT: moist mucous membranes, Thyroidectomy scar healed, no cervical lymphadenopathy Cardiovascular: RRR at the end of the appt, No MRG Respiratory: CTA B Gastrointestinal: abdomen soft, NT, ND, BS+ Musculoskeletal: no deformities, strength intact in all 4 Skin: moist, warm, no rashes Neurological: no tremor with outstretched hands, DTR normal in all 4   ASSESSMENT: 1. H/o MNG - FLUS x 2 in RLL thyroid nodule - now s/p total thyroidectomy at [facility]  2. Postsurgical hypothyroidism  PLAN: 1.  - Pt with a h/o multinodular goiter, with one nodule biopsy beeing inconclusive 2x.  I recommended surgery especially to avoid a reintervention in case se turned out to have cancer >> she had sx in 06/2015 >> benign path - thyroidectomy scar is completely healed  2. Postsurgical hypothyroidism - on LT4 112 mcg, with normal TFTs at last check. - at last visit, she had a rash on bilateral shins and dorsum of feet >> we switched to LT4  50 mcg  X 2 + 1/4 of another tablet per day  To avoid coloring agents and other excipients >> rash better but still present >> will go back to the 112 mcg tab - latest thyroid labs reviewed with pt >> normal  - pt feels good on this dose, but would like to lose more weight >> I gave her some diet suggestions - we discussed about taking the thyroid hormone every day, with water, >30 minutes before breakfast, separated by >4 hours from acid reflux medications, calcium, iron, multivitamins. Pt. is taking it correctly - will check thyroid tests today: TSH and fT4 - If labs are abnormal, she will need to return for repeat TFTs in  1.5 months - OTW, Return in about 6 months (around 07/13/2017).   Office Visit on 01/10/2017  Component Date Value Ref Range Status  . TSH 01/10/2017 4.75* 0.35 - 4.50 uIU/mL Final  . Free T4 01/10/2017 1.09  0.60 - 1.60 ng/dL Final   Comment: Specimens from patients who are undergoing biotin therapy and /or ingesting biotin supplements may contain high levels of biotin.  The higher biotin concentration in these specimens interferes with this Free T4 assay.  Specimens that contain high levels  of biotin may cause false high results for this Free T4 assay.  Please interpret results in light of the total clinical presentation of the patient.      Patient's TSH is a little high. I suggested to either go back to the 112 g daily pill or she can alternate 2x 50 mcg tablets + 1/2 tab with 2x 50 mg tablets + 1/4 tab every other day. We'll need another set of labs in 2 months.   Philemon Kingdom, MD PhD [facility] Endocrinology

## 2017-01-11 ENCOUNTER — Telehealth: Payer: Self-pay

## 2017-01-11 ENCOUNTER — Telehealth: Payer: Self-pay | Admitting: Internal Medicine

## 2017-01-11 NOTE — Telephone Encounter (Signed)
Called and talked to patient about the medication changes. Patient understood and would call if she has any issues.

## 2017-01-11 NOTE — Telephone Encounter (Signed)
Patient needs clarification on levothyroxine change. She is unsure of  New directions.

## 2017-01-11 NOTE — Telephone Encounter (Signed)
-----   Message from Philemon Kingdom, MD sent at 01/10/2017  4:55 PM EDT ----- Almyra Free, can you please call pt: Patient's TSH is a little bit high. I think it would be safer to go back to the 112 g daily pill. Alternatively, she can alternate 2x 50 mcg tablets + 1/2 tab with 2x 50 mg tablets + 1/4 tab every other day. We'll need another set of labs in 2 months. I will order them.

## 2017-01-11 NOTE — Telephone Encounter (Signed)
Called patient and gave lab results. Patient states she has the 112 mcg left from previous, she will try to take these and see if she breaks out. If she does she will go back to alternating, she does not enjoy cutting them in half and would rather not do that. She will let us know if we need to send in a new rx with the 112 after she sees if she breaks out with this one pill.  Patient had no other questions at this time.

## 2017-01-11 NOTE — Telephone Encounter (Signed)
Called patient and advised of note from [place]. MD was submitted what patient would like to do regarding medications.

## 2017-01-27 ENCOUNTER — Ambulatory Visit: Payer: Medicare Other | Admitting: Internal Medicine

## 2017-02-13 ENCOUNTER — Other Ambulatory Visit: Payer: Self-pay | Admitting: Internal Medicine

## 2017-02-21 ENCOUNTER — Other Ambulatory Visit: Payer: Self-pay | Admitting: Internal Medicine

## 2017-02-23 ENCOUNTER — Other Ambulatory Visit: Payer: Self-pay

## 2017-02-23 MED ORDER — LEVOTHYROXINE SODIUM 112 MCG PO TABS: ORAL_TABLET | ORAL | 1 refills | Status: DC

## 2017-02-24 ENCOUNTER — Other Ambulatory Visit: Payer: Self-pay

## 2017-02-24 MED ORDER — SYNTHROID 112 MCG PO TABS: ORAL_TABLET | ORAL | 1 refills | Status: DC

## 2017-02-28 ENCOUNTER — Telehealth: Payer: Self-pay

## 2017-02-28 ENCOUNTER — Telehealth: Payer: Self-pay | Admitting: Internal Medicine

## 2017-02-28 MED ORDER — SYNTHROID 112 MCG PO TABS: ORAL_TABLET | ORAL | 2 refills | Status: DC

## 2017-02-28 NOTE — Telephone Encounter (Signed)
Called patient and cleared up the questions. Patient did not know that the Synthroid and levothyroxine were the same medication. I sent in for a 30 day, for price until the generic levothyroxine was off of back order. Patient would see the price of this and call back if not.

## 2017-02-28 NOTE — Telephone Encounter (Signed)
Please advise, I know we had to switch her because of back order on 50 mcg, is the whole tablet of 112 correct? Just wanted to check. Thanks!

## 2017-02-28 NOTE — Telephone Encounter (Signed)
Patient needs a call from the nurse to discuss if there was a change in her medication to the SYNTHROID 112 MCG tablet vs the      levothyroxine (SYNTHROID, LEVOTHROID) 50 MCG tablet   .  [id number] [address], [place] [address] (Phone) [phone] (Fax)   Told the patient that the levothyroxine (SYNTHROID, LEVOTHROID) 50 MCG tablet was on back order and that Dr. Cruzita Lederer changed her medications.   Call patient to advise.

## 2017-02-28 NOTE — Telephone Encounter (Signed)
We can call in LT4 112 if her rash was not better with the 50 mcg tablets. Her actual dose is 112 mcg per day but we used 50 mcg tabs as they are colorless.

## 2017-03-13 ENCOUNTER — Other Ambulatory Visit: Payer: Medicare Other

## 2017-03-13 ENCOUNTER — Other Ambulatory Visit (INDEPENDENT_AMBULATORY_CARE_PROVIDER_SITE_OTHER): Payer: Medicare Other

## 2017-03-13 DIAGNOSIS — E890 Postprocedural hypothyroidism: Secondary | ICD-10-CM | POA: Diagnosis not present

## 2017-03-13 LAB — TSH: TSH: 2.66 u[IU]/mL (ref 0.35–4.50)

## 2017-03-13 LAB — T4, FREE: FREE T4: 1.31 ng/dL (ref 0.60–1.60)

## 2017-03-14 ENCOUNTER — Telehealth: Payer: Self-pay

## 2017-03-14 NOTE — Telephone Encounter (Signed)
-----   Message from Philemon Kingdom, MD sent at 03/13/2017  4:59 PM EDT ----- Almyra Free, can you please call pt: TFTs now normal >> cont. currene LT4 dose.

## 2017-03-14 NOTE — Telephone Encounter (Signed)
Called patient and gave lab results. Patient had no questions or concerns.  

## 2017-03-22 ENCOUNTER — Other Ambulatory Visit: Payer: Self-pay | Admitting: Internal Medicine

## 2017-04-13 ENCOUNTER — Telehealth: Payer: Self-pay | Admitting: Family Medicine

## 2017-04-13 NOTE — Telephone Encounter (Signed)
Patient will be going to Dr. Latanya Maudlin to start series of gel shots.  She wants to make sure have Mitzi Hansen at [facility] clinic. Also, please tell him that she will get 3 more shots.thanks

## 2017-06-20 ENCOUNTER — Other Ambulatory Visit: Payer: Self-pay | Admitting: Internal Medicine

## 2017-07-13 ENCOUNTER — Encounter: Payer: Self-pay | Admitting: Internal Medicine

## 2017-07-13 ENCOUNTER — Ambulatory Visit: Payer: Medicare Other | Admitting: Internal Medicine

## 2017-07-13 VITALS — BP 140/80 | HR 86 | Ht 58.75 in | Wt 162.3 lb

## 2017-07-13 DIAGNOSIS — E890 Postprocedural hypothyroidism: Secondary | ICD-10-CM | POA: Diagnosis not present

## 2017-07-13 LAB — TSH: TSH: 2.46 u[IU]/mL (ref 0.35–4.50)

## 2017-07-13 LAB — T4, FREE: Free T4: 1.31 ng/dL (ref 0.60–1.60)

## 2017-07-13 LAB — CALCIUM: CALCIUM: 9.6 mg/dL (ref 8.4–10.5)

## 2017-07-13 NOTE — Progress Notes (Signed)
Patient ID: DORENE BRUNI, female   DOB: 05/18/1936, 82 y.o.   MRN: [id number]   HPI  Haruna Rohlfs Navratil is a 82 y.o.-year-old female, initially referred by her ENT dr, Dr Dagmar Hait, now returning for f/u for postsurgical hypothyroidism after total thyroidectomy for MNG. Last visit 6 months ago.  She sis having gel injections in knee >> she feels better.  She continues to cut down sweets and carbs in general.  Reviewed history: Pt has a h/o MNG, with 2x FLUS biopsies >> thyroidectomy >> benign pathology. She had total thyroidectomy at [facility] (Dr Eliezer Champagne) on 06/04/2015 - had a great experience! She feels well after the surgery, w/o fatigue.   However, at last visit, she had the rash, and we had to switch to 50 g levothyroxine tablets since they did not contain coloring agents or other aches and pains >> 50 g 2 tablets +1/4 tablet.  Rash was still present.  Therefore, we changed back to 112 mcg daily levothyroxine.  She changed to Synthroid DAW >> rash now resolved.  I reviewed pt's thyroid tests: Lab Results  Component Value Date   TSH 2.66 03/13/2017   TSH 4.75 (H) 01/10/2017   TSH 1.66 08/02/2016   TSH 3.12 02/01/2016   TSH 2.55 09/15/2015   TSH 3.36 07/16/2015   FREET4 1.31 03/13/2017   FREET4 1.09 01/10/2017   FREET4 1.39 08/02/2016   FREET4 1.21 02/01/2016   FREET4 1.26 09/15/2015   FREET4 1.31 07/16/2015  04/2014: TSH normal by Dr. Ignacia Bayley (ENT)  Pt is now on Synthroid 112 mcg daily, taken: - at 4 am - fasting - Approximately 3 hours n from b'fast - no Fe, MVI, PPIs - + Calcium at bedtime - not on Biotin  On 600 mg calcium >> constipation.  She had transient hypocalcemia after the surgery, but this has resolved: Lab Results  Component Value Date   CALCIUM 9.8 09/15/2015   Pt denies: - feeling nodules in neck - hoarseness - dysphagia - choking - SOB with lying down  Pt does have a FH of thyroid ds.: GM with goiter, mother with mild  hypothyroidism. No FH of thyroid cancer. No h/o radiation tx to head or neck.  No seaweed or kelp. No recent contrast studies. No herbal supplements. No Biotin use. No recent steroids use.   ROS: Constitutional: + weight loss, no fatigue, no subjective hyperthermia, no subjective hypothermia Eyes: no blurry vision, no xerophthalmia ENT: no sore throat, no nodules palpated in throat, no dysphagia, no odynophagia, no hoarseness Cardiovascular: no CP/no SOB/no palpitations/no leg swelling Respiratory: no cough/no SOB/no wheezing Gastrointestinal: no N/no V/no D/no C/no acid reflux Musculoskeletal: no muscle aches/+ joint aches Skin: + rash - legs >> improved, no hair loss Neurological: no tremors/no numbness/no tingling/no dizziness  I reviewed pt's medications, allergies, PMH, social hx, family hx, and changes were documented in the history of present illness. Otherwise, unchanged from my initial visit note.  Past Medical History:  Diagnosis Date  . Adenomatous polyp of colon 2008  . Arthritis    hands & knees  . Atrophic vaginitis    in past  . Lichen sclerosus    Per pt, denies this   . Post-operative nausea and vomiting    no vomiting   Past Surgical History:  Procedure Laterality Date  . THYROIDECTOMY  06/04/15   At [facility]  . TONSILLECTOMY AND ADENOIDECTOMY  childhood   Social History   Socioeconomic History  . Marital status: Married  Spouse name: Not on file  . Number of children: 2  . Years of education: Not on file  . Highest education level: Not on file  Social Needs  . Financial resource strain: Not on file  . Food insecurity - worry: Not on file  . Food insecurity - inability: Not on file  . Transportation needs - medical: Not on file  . Transportation needs - non-medical: Not on file  Occupational History  . Not on file  Tobacco Use  . Smoking status: Never Smoker  . Smokeless tobacco: Never Used  Substance and Sexual Activity  . Alcohol use: No     Alcohol/week: 0.0 oz  . Drug use: No  . Sexual activity: No    Partners: Male    Birth control/protection: Other-see comments    Comment: husband vasectomy  Other Topics Concern  . Not on file  Social History Narrative  . Not on file   Current Outpatient Medications on File Prior to Visit  Medication Sig Dispense Refill  . cetirizine (ZYRTEC) 10 MG tablet Take 10 mg by mouth as needed.     . cholecalciferol (VITAMIN D) 1000 UNITS tablet Take 1,000 Units by mouth daily. Vit d 3 1000 units-Take 2 daily    . erythromycin ophthalmic ointment     . lisinopril (PRINIVIL,ZESTRIL) 10 MG tablet Take 10 mg by mouth daily.    Marland Kitchen losartan-hydrochlorothiazide (HYZAAR) 50-12.5 MG tablet     . SYNTHROID 112 MCG tablet Take 1 tab every morning 30-60 minutes before breakfast on empty stomach with glass of water. 30 tablet 2   No current facility-administered medications on file prior to visit.    Allergies  Allergen Reactions  . Codeine Nausea And Vomiting    fainting  . Neomycin-Bacitracin Zn-Polymyx Other (See Comments)    Blisters skin  . Sulfa Antibiotics     Caused nausea   Family History  Problem Relation Age of Onset  . Thyroid disease Mother   . Osteoarthritis Mother   . Cancer Father        prostate & spread to lungs  . Thyroid disease Maternal Grandmother        goiter   PE: BP 140/80   Pulse 86   Ht 4' 10.75" (1.492 m)   Wt 162 lb 4.8 oz (73.6 kg)   LMP 07/04/1998   SpO2 97%   BMI 33.06 kg/m  Wt Readings from Last 3 Encounters:  07/13/17 162 lb 4.8 oz (73.6 kg)  01/10/17 166 lb (75.3 kg)  08/02/16 170 lb (77.1 kg)   Constitutional: overweight, in NAD Eyes: PERRLA, EOMI, no exophthalmos ENT: moist mucous membranes, no neck masses palpable, thyroidectomy scar healed, no cervical lymphadenopathy Cardiovascular: RRR, No MRG Respiratory: CTA B Gastrointestinal: abdomen soft, NT, ND, BS+ Musculoskeletal: no deformities, strength intact in all 4 Skin: moist, warm, no  rashes Neurological: no tremor with outstretched hands, DTR normal in all 4  ASSESSMENT: 1. H/o MNG - FLUS x 2 in RLL thyroid nodule - now s/p total thyroidectomy at [facility]  2. Postsurgical hypothyroidism  PLAN: 1.  - Pt with a h/o multinodular goiter, with one nodule biopsy being inconclusive x2 in the past, after which we proceeded with total thyroidectomy in 06/2015.  The path was benign. - No neck masses felt in her thyroidectomy scar is completely healed  2. Postsurgical hypothyroidism at last visit, she had rash - At last visit, she had a rash on her shins and we tried to switch the  levothyroxine formulation to see if the rash could be due to the excipients from the tablets.  The intensity did not change, so she is now back on 112 mcg levothyroxine daily. Now on brand name Synthroid >> rash is almost gone.  - latest thyroid labs reviewed with pt >> normal  - she continues on Synthroid 112 mcg daily - pt feels good on this dose. - we discussed about taking the thyroid hormone every day, with water, >30 minutes before breakfast, separated by >4 hours from acid reflux medications, calcium, iron, multivitamins. Pt. is taking it correctly - will check thyroid tests today: TSH and fT4 - If labs are abnormal, she will need to return for repeat TFTs in 1.5 months - OTW, RTC in 6 mo for a repeat and 1 year for a visit  3. H/o postop hypocalcemia - on Ca 600 mg at night >> constipation - will check level today - will try to stop the supplement >> will have labs with PCP in 3-4 mo - discussed signs and sxs of hypocalcemia and to alert me if they appear.  Office Visit on 07/13/2017  Component Date Value Ref Range Status  . TSH 07/13/2017 2.46  0.35 - 4.50 uIU/mL Final  . Free T4 07/13/2017 1.31  0.60 - 1.60 ng/dL Final   Comment: Specimens from patients who are undergoing biotin therapy and /or ingesting biotin supplements may contain high levels of biotin.  The higher biotin concentration  in these specimens interferes with this Free T4 assay.  Specimens that contain high levels  of biotin may cause false high results for this Free T4 assay.  Please interpret results in light of the total clinical presentation of the patient.    . Calcium 07/13/2017 9.6  8.4 - 10.5 mg/dL Final   TFTs and calcium are normal >> OK to stop calcium supplement as discussed.  Philemon Kingdom, MD PhD [facility] Endocrinology

## 2017-07-13 NOTE — Patient Instructions (Addendum)
Please stop at the lab.  Stop calcium.  Please stay on the Levothyroxine 112 mcg.  Take the thyroid hormone every day, with water, at least 30 minutes before breakfast, separated by at least 4 hours from: - acid reflux medications - calcium - iron - multivitamins  Please return in 6 months for labs and in 1 year for a visit.

## 2018-01-03 ENCOUNTER — Other Ambulatory Visit: Payer: Self-pay | Admitting: Internal Medicine

## 2018-01-03 DIAGNOSIS — E890 Postprocedural hypothyroidism: Secondary | ICD-10-CM

## 2018-01-10 ENCOUNTER — Other Ambulatory Visit (INDEPENDENT_AMBULATORY_CARE_PROVIDER_SITE_OTHER): Payer: Medicare Other

## 2018-01-10 DIAGNOSIS — E890 Postprocedural hypothyroidism: Secondary | ICD-10-CM

## 2018-01-10 LAB — TSH: TSH: 1.93 u[IU]/mL (ref 0.35–4.50)

## 2018-01-10 LAB — T4, FREE: Free T4: 1.4 ng/dL (ref 0.60–1.60)

## 2018-01-12 ENCOUNTER — Other Ambulatory Visit: Payer: Self-pay | Admitting: Family Medicine

## 2018-01-12 DIAGNOSIS — Z1231 Encounter for screening mammogram for malignant neoplasm of breast: Secondary | ICD-10-CM

## 2018-01-17 ENCOUNTER — Ambulatory Visit
Admission: RE | Admit: 2018-01-17 | Discharge: 2018-01-17 | Disposition: A | Discharge status: Home / Self Care | Payer: Medicare Other | Source: Ambulatory Visit | Attending: Family Medicine | Admitting: Family Medicine

## 2018-01-17 DIAGNOSIS — Z1231 Encounter for screening mammogram for malignant neoplasm of breast: Secondary | ICD-10-CM

## 2018-03-10 ENCOUNTER — Other Ambulatory Visit: Payer: Self-pay | Admitting: Internal Medicine

## 2018-07-13 ENCOUNTER — Ambulatory Visit: Payer: Medicare Other | Admitting: Internal Medicine

## 2018-07-13 ENCOUNTER — Encounter: Payer: Self-pay | Admitting: Internal Medicine

## 2018-07-13 VITALS — BP 180/80 | HR 89 | Ht 58.27 in | Wt 162.0 lb

## 2018-07-13 DIAGNOSIS — E890 Postprocedural hypothyroidism: Secondary | ICD-10-CM

## 2018-07-13 LAB — T4, FREE: Free T4: 1.47 ng/dL (ref 0.60–1.60)

## 2018-07-13 LAB — TSH: TSH: 3.25 u[IU]/mL (ref 0.35–4.50)

## 2018-07-13 NOTE — Progress Notes (Signed)
Patient ID: Vanessa Clements, female   DOB: Jun 10, 1936, 83 y.o.   MRN: [id number]   HPI  Vanessa Clements is a 83 y.o.-year-old female, initially referred by her ENT dr, Dr Vanessa Clements, now returning for f/u for postsurgical hypothyroidism after total thyroidectomy for MNG. Last visit 6 months ago.  She had few basal cancer lesions removed, including one on the scalp. She had 1 lesion before, >30 years ago.  Reviewed history: Pt has a h/o MNG, with 2x FLUS biopsies >> thyroidectomy >> benign pathology. She had total thyroidectomy at [facility] (Dr Vanessa Clements) on 06/04/2015 - had a great experience! She feels well after the surgery, w/o fatigue.   However, at last visit, she had the rash, and we had to switch to 50 g levothyroxine tablets since they did not contain coloring agents or other aches and pains >> 50 g 2 tablets +1/4 tablet.  Rash was still present.  Therefore, we changed back to 112 mcg daily levothyroxine.  She changed to Synthroid DAW >> rash now resolved.  Reviewed patient's TFTs: Lab Results  Component Value Date   TSH 1.93 01/10/2018   TSH 2.46 07/13/2017   TSH 2.66 03/13/2017   TSH 4.75 (H) 01/10/2017   TSH 1.66 08/02/2016   TSH 3.12 02/01/2016   FREET4 1.40 01/10/2018   FREET4 1.31 07/13/2017   FREET4 1.31 03/13/2017   FREET4 1.09 01/10/2017   FREET4 1.39 08/02/2016   FREET4 1.21 02/01/2016  04/2014: TSH normal by Dr. Ignacia Clements (ENT)  Pt is on Synthroid 112 Mcg daily, taken: - in am (at 4 AM) - fasting - Approximately 3 hours from b'fast - no Ca, Fe, MVI, PPIs - not on Biotin  At last visit she was on calcium 600 mg daily but this caused constipation.  We stopped the supplement.  She had transient hypocalcemia after the surgery, but this is resolved: Lab Results  Component Value Date   CALCIUM 9.6 07/13/2017   Pt denies: - feeling nodules in neck - hoarseness - dysphagia - choking - SOB with lying down  Pt does have a FH of thyroid ds.: GM  with goiter, mother with mild hypothyroidism. No FH of thyroid cancer. No h/o radiation tx to head or neck.  No herbal supplements. No Biotin use. No recent steroids use.   History of gel injections in knees.  ROS: Constitutional: no weight gain/no weight loss, no fatigue, no subjective hyperthermia, no subjective hypothermia Eyes: no blurry vision, no xerophthalmia ENT: no sore throat, + see HPI Cardiovascular: no CP/no SOB/no palpitations/no leg swelling Respiratory: no cough/no SOB/no wheezing Gastrointestinal: no N/no V/no D/no C/no acid reflux Musculoskeletal: no muscle aches/no joint aches Skin: no rashes, no hair loss Neurological: no tremors/no numbness/no tingling/no dizziness  I reviewed pt's medications, allergies, PMH, social hx, family hx, and changes were documented in the history of present illness. Otherwise, unchanged from my initial visit note.  Past Medical History:  Diagnosis Date  . Adenomatous polyp of colon 2008  . Arthritis    hands & knees  . Atrophic vaginitis    in past  . Lichen sclerosus    Per pt, denies this   . Post-operative nausea and vomiting    no vomiting   Past Surgical History:  Procedure Laterality Date  . THYROIDECTOMY  06/04/15   At [facility]  . TONSILLECTOMY AND ADENOIDECTOMY  childhood   Social History   Socioeconomic History  . Marital status: Married    Spouse name: Not on file  .  Number of children: 2  . Years of education: Not on file  . Highest education level: Not on file  Occupational History  . Not on file  Social Needs  . Financial resource strain: Not on file  . Food insecurity:    Worry: Not on file    Inability: Not on file  . Transportation needs:    Medical: Not on file    Non-medical: Not on file  Tobacco Use  . Smoking status: Never Smoker  . Smokeless tobacco: Never Used  Substance and Sexual Activity  . Alcohol use: No    Alcohol/week: 0.0 standard drinks  . Drug use: No  . Sexual activity: Never     Partners: Male    Birth control/protection: Other-see comments    Comment: husband vasectomy  Lifestyle  . Physical activity:    Days per week: Not on file    Minutes per session: Not on file  . Stress: Not on file  Relationships  . Social connections:    Talks on phone: Not on file    Gets together: Not on file    Attends religious service: Not on file    Active member of club or organization: Not on file    Attends meetings of clubs or organizations: Not on file    Relationship status: Not on file  . Intimate partner violence:    Fear of current or ex partner: Not on file    Emotionally abused: Not on file    Physically abused: Not on file    Forced sexual activity: Not on file  Other Topics Concern  . Not on file  Social History Narrative  . Not on file   Current Outpatient Medications on File Prior to Visit  Medication Sig Dispense Refill  . cetirizine (ZYRTEC) 10 MG tablet Take 10 mg by mouth as needed.     . cholecalciferol (VITAMIN D) 1000 UNITS tablet Take 1,000 Units by mouth daily. Vit d 3 1000 units-Take 2 daily    . erythromycin ophthalmic ointment     . lisinopril (PRINIVIL,ZESTRIL) 10 MG tablet Take 10 mg by mouth daily.    Marland Kitchen losartan-hydrochlorothiazide (HYZAAR) 50-12.5 MG tablet     . SYNTHROID 112 MCG tablet Take 1 tab every morning 30-60 minutes before breakfast on empty stomach with glass of water. 30 tablet 2  . SYNTHROID 112 MCG tablet TAKE 1 TAB EVERY MORNING 30-60 MINUTES BEFORE BREAKFAST ON EMPTY STOMACH WITH GLASS OF WATER. 90 tablet 1   No current facility-administered medications on file prior to visit.    Allergies  Allergen Reactions  . Codeine Nausea And Vomiting    fainting  . Neomycin-Bacitracin Zn-Polymyx Other (See Comments)    Blisters skin  . Sulfa Antibiotics     Caused nausea   Family History  Problem Relation Age of Onset  . Thyroid disease Mother   . Osteoarthritis Mother   . Cancer Father        prostate & spread to lungs   . Thyroid disease Maternal Grandmother        goiter   PE: BP (!) 180/80   Pulse 89   Ht 4' 10.27" (1.48 m)   Wt 162 lb (73.5 kg)   LMP 07/04/1998   SpO2 96%   BMI 33.55 kg/m  Wt Readings from Last 3 Encounters:  07/13/18 162 lb (73.5 kg)  07/13/17 162 lb 4.8 oz (73.6 kg)  01/10/17 166 lb (75.3 kg)   Constitutional: overweight, in NAD  Eyes: PERRLA, EOMI, no exophthalmos ENT: moist mucous membranes, no neck masses, thyroidectomy scar healed, no cervical lymphadenopathy Cardiovascular: RRR, No MRG Respiratory: CTA B Gastrointestinal: abdomen soft, NT, ND, BS+ Musculoskeletal: no deformities, strength intact in all 4 Skin: moist, warm, no rashes Neurological: no tremor with outstretched hands, DTR normal in all 4  ASSESSMENT: 1. H/o MNG - FLUS x 2 in RLL thyroid nodule - now s/p total thyroidectomy at [facility]  2. Postsurgical hypothyroidism  3.  History of postop hypocalcemia  PLAN: 1.  - Pt with a history of multinodular goiter, with one nodule biopsy being inconclusive x2 in the past, after which we proceeded with total thyroidectomy in 06/2015.  Pathology was benign. - She has no neck masses on palpation and no compression symptoms.  2. Postsurgical hypothyroidism -In the past, she had a rash possibly from levothyroxine so we switched to brand name Synthroid.  The rash slowly resolved.  It is unclear whether levothyroxine was causing it. - latest thyroid labs reviewed with pt >> normal  - she continues on Synthroid 112 mcg daily - pt feels good on this dose. - we discussed about taking the thyroid hormone every day, with water, >30 minutes before breakfast, separated by >4 hours from acid reflux medications, calcium, iron, multivitamins. Pt. is taking it correctly. - will check thyroid tests today: TSH and fT4 - If labs are abnormal, she will need to return for repeat TFTs in 1.5  - if normal, will check labs in 6 mo and RTC for a visit in 1 year  3. H/o postop  hypocalcemia - resolved - calcium 600 mg caused constipation in the past - calcium normal at last check >> will not repeat today - we stopped her Ca supplement then - Discussed about signs and symptoms of hypocalcemia and to alert me if they appear  Office Visit on 07/13/2018  Component Date Value Ref Range Status  . TSH 07/13/2018 3.25  0.35 - 4.50 uIU/mL Final  . Free T4 07/13/2018 1.47  0.60 - 1.60 ng/dL Final   Comment: Specimens from patients who are undergoing biotin therapy and /or ingesting biotin supplements may contain high levels of biotin.  The higher biotin concentration in these specimens interferes with this Free T4 assay.  Specimens that contain high levels  of biotin may cause false high results for this Free T4 assay.  Please interpret results in light of the total clinical presentation of the patient.     Thyroid tests are normal.  Philemon Kingdom, MD PhD [facility] Endocrinology

## 2018-07-13 NOTE — Patient Instructions (Signed)
Please stop at the lab. ? ?Please stay on the Levothyroxine 112 mcg. ? ?Take the thyroid hormone every day, with water, at least 30 minutes before breakfast, separated by at least 4 hours from: ?- acid reflux medications ?- calcium ?- iron ?- multivitamins ? ?Please return in 6 months for labs and in 1 year for a visit. ?

## 2018-11-13 ENCOUNTER — Other Ambulatory Visit: Payer: Self-pay | Admitting: Internal Medicine

## 2018-12-10 ENCOUNTER — Other Ambulatory Visit: Payer: Self-pay | Admitting: Family Medicine

## 2018-12-10 DIAGNOSIS — Z1231 Encounter for screening mammogram for malignant neoplasm of breast: Secondary | ICD-10-CM

## 2019-01-16 ENCOUNTER — Other Ambulatory Visit: Payer: Medicare Other

## 2019-01-17 LAB — TSH: TSH: 0.6 (ref 0.41–5.90)

## 2019-01-21 ENCOUNTER — Telehealth: Payer: Self-pay | Admitting: Internal Medicine

## 2019-01-21 NOTE — Telephone Encounter (Signed)
Patient had labs done at her PCP office and wanted to make sure Dr.Gherghe got her results.  Dr.Smith Ph # [phone]  Please Advise, Thanks

## 2019-01-22 ENCOUNTER — Encounter: Payer: Self-pay | Admitting: Internal Medicine

## 2019-01-22 LAB — T3, FREE

## 2019-01-22 LAB — T4, FREE: T4,Free (Direct): 1.38

## 2019-01-22 NOTE — Telephone Encounter (Signed)
Dr. Cruzita Lederer received results and reviewed them as normal.  Notified patient of message from Dr. Cruzita Lederer, patient expressed understanding and agreement. No further questions.  Results abstracted into chart.

## 2019-01-25 ENCOUNTER — Telehealth: Payer: Self-pay | Admitting: Internal Medicine

## 2019-01-25 NOTE — Telephone Encounter (Signed)
I reviewed the recent TFTs for this month. Please continue the current Levothyroxine dose.

## 2019-01-25 NOTE — Telephone Encounter (Signed)
Called patient, phone rang a few times then the line dropped.  Will try again later.

## 2019-01-25 NOTE — Telephone Encounter (Signed)
Spoke to patient, she states her PCP sent her a letter telling her that the T4 is increased meaning she is taking too much of her medication and recommends she cut down to 100 mcg daily.  Patient is confused as to why her PCP has told her this when we said they are normal? Patient wants to make sure what she should be doing.

## 2019-01-25 NOTE — Telephone Encounter (Signed)
Notified patient of message from Dr. Gherghe, patient expressed understanding and agreement. No further questions.  

## 2019-01-25 NOTE — Telephone Encounter (Signed)
Patient requests that [facility] call patient at ph# [phone]. Patient would like to discuss the test results further.

## 2019-02-25 ENCOUNTER — Ambulatory Visit
Admission: RE | Admit: 2019-02-25 | Discharge: 2019-02-25 | Disposition: A | Discharge status: Home / Self Care | Payer: Medicare Other | Source: Ambulatory Visit | Attending: Family Medicine | Admitting: Family Medicine

## 2019-02-25 ENCOUNTER — Other Ambulatory Visit: Payer: Self-pay

## 2019-02-25 DIAGNOSIS — Z1231 Encounter for screening mammogram for malignant neoplasm of breast: Secondary | ICD-10-CM

## 2019-05-09 ENCOUNTER — Other Ambulatory Visit: Payer: Self-pay | Admitting: Internal Medicine

## 2019-07-08 ENCOUNTER — Telehealth: Payer: Self-pay

## 2019-07-08 MED ORDER — SYNTHROID 112 MCG PO TABS: ORAL_TABLET | ORAL | 1 refills | Status: DC

## 2019-07-08 NOTE — Telephone Encounter (Signed)
MEDICATION: SYNTHROID 112 MCG tablet  PHARMACY:  [id number] [place],  - [address]  IS THIS A 90 DAY SUPPLY : yes   IS PATIENT OUT OF MEDICATION: yes   IF NOT; HOW MUCH IS LEFT:   LAST APPOINTMENT DATE: @11 /11/2018  NEXT APPOINTMENT DATE:@3 /05/2020  DO WE HAVE YOUR PERMISSION TO LEAVE A DETAILED MESSAGE:  OTHER COMMENTS:    **Let patient know to contact pharmacy at the end of the day to make sure medication is ready. **  ** Please notify patient to allow 48-72 hours to process**  **Encourage patient to contact the pharmacy for refills or they can request refills through [facility]**

## 2019-07-08 NOTE — Telephone Encounter (Signed)
RX sent

## 2019-07-17 ENCOUNTER — Ambulatory Visit: Payer: Medicare Other | Admitting: Internal Medicine

## 2019-07-20 ENCOUNTER — Ambulatory Visit: Payer: Medicare Other | Attending: Internal Medicine

## 2019-07-20 DIAGNOSIS — Z23 Encounter for immunization: Secondary | ICD-10-CM

## 2019-08-09 ENCOUNTER — Ambulatory Visit: Payer: Medicare Other | Attending: Internal Medicine

## 2019-08-09 DIAGNOSIS — Z23 Encounter for immunization: Secondary | ICD-10-CM | POA: Insufficient documentation

## 2019-08-09 NOTE — Progress Notes (Signed)
   Covid-19 Vaccination Clinic  Name:  Vanessa Clements    MRN: [id number] DOB: Feb 22, 1936  08/09/2019  [name] was observed post Covid-19 immunization for 15 minutes without incidence. She was provided with Vaccine Information Sheet and instruction to access the V-Safe system.   [name] was instructed to call 911 with any severe reactions post vaccine: Marland Kitchen Difficulty breathing  . Swelling of your face and throat  . A fast heartbeat  . A bad rash all over your body  . Dizziness and weakness    Immunizations Administered    Name Date Dose VIS Date Route   Pfizer COVID-19 Vaccine 08/09/2019  1:38 PM 0.3 mL 06/14/2019 Intramuscular   Manufacturer: [place]   Lot: [id number]   [place]: [id number]

## 2019-09-10 ENCOUNTER — Other Ambulatory Visit: Payer: Self-pay

## 2019-09-12 ENCOUNTER — Encounter: Payer: Self-pay | Admitting: Internal Medicine

## 2019-09-12 ENCOUNTER — Ambulatory Visit: Payer: Medicare Other | Admitting: Internal Medicine

## 2019-09-12 ENCOUNTER — Other Ambulatory Visit: Payer: Self-pay

## 2019-09-12 VITALS — BP 150/80 | HR 100 | Ht 58.5 in | Wt 162.0 lb

## 2019-09-12 DIAGNOSIS — E890 Postprocedural hypothyroidism: Secondary | ICD-10-CM | POA: Diagnosis not present

## 2019-09-12 LAB — CALCIUM: Calcium: 9.4 mg/dL (ref 8.4–10.5)

## 2019-09-12 LAB — TSH: TSH: 1.08 u[IU]/mL (ref 0.35–4.50)

## 2019-09-12 LAB — T4, FREE: Free T4: 1.39 ng/dL (ref 0.60–1.60)

## 2019-09-12 NOTE — Progress Notes (Signed)
Patient ID: Vanessa Clements, female   DOB: [birth year], 84 y.o.   MRN: [id number]  This visit occurred during the SARS-CoV-2 public health emergency.  Safety protocols were in place, including screening questions prior to the visit, additional usage of staff PPE, and extensive cleaning of exam room while observing appropriate contact time as indicated for disinfecting solutions.   HPI  Vanessa Clements is a 84 y.o.-year-old female, initially referred by her ENT dr, Dr Dagmar Hait, now returning for f/u for postsurgical hypothyroidism after total thyroidectomy for MNG. Last visit 1 year and 2 months ago.  She is scheduled this appointment now, 1 month after she got the second dose of her COVID-19 vaccine.  Reviewed history: Pt has a h/o MNG, with 2x FLUS biopsies >> thyroidectomy >> benign pathology. She had total thyroidectomy at [facility] (Dr Eliezer Champagne) on 06/04/2015 - had a great experience! She feels well after the surgery, w/o fatigue.   However, at last visit, she had the rash, and we had to switch to 50 g levothyroxine tablets since they did not contain coloring agents or other aches and pains >> 50 g 2 tablets +1/4 tablet.  Rash was still present.  Therefore, we changed back to 112 mcg daily levothyroxine.  She changed to Synthroid d.a.w.  Rash resolved.  Reviewed her TFTs: Lab Results  Component Value Date   TSH 0.60 01/17/2019   TSH 3.25 07/13/2018   TSH 1.93 01/10/2018   TSH 2.46 07/13/2017   TSH 2.66 03/13/2017   TSH 4.75 (H) 01/10/2017   FREET4 1.47 07/13/2018   FREET4 1.40 01/10/2018   FREET4 1.31 07/13/2017   FREET4 1.31 03/13/2017   FREET4 1.09 01/10/2017   FREET4 1.39 08/02/2016  04/2014: TSH normal by Dr. Ignacia Bayley (ENT)  Pt is on Synthroid d.a.w. 112 mcg daily, taken: - in am - fasting - at least 2h from b'fast - no Ca, Fe, MVI, PPIs - not on Biotin  At last visit she was on calcium 600 mg daily but this caused constipation.  We stopped the  supplement.  She had transient hypocalcemia after the surgery but this resolved: Lab Results  Component Value Date   CALCIUM 9.6 07/13/2017   Vit D levels: No results found for: VD25OH   Pt denies: - feeling nodules in neck - hoarseness - dysphagia - choking - SOB with lying down  Pt does have a FH of thyroid ds.: GM with goiter, mother with mild hypothyroidism. No FH of thyroid cancer. No h/o radiation tx to head or neck.  No seaweed or kelp. No recent contrast studies. No herbal supplements. No Biotin use. No recent steroids use.   History of gel injections in knees.  ROS: Constitutional: no weight gain/no weight loss, no fatigue, no subjective hyperthermia, no subjective hypothermia Eyes: no blurry vision, no xerophthalmia ENT: no sore throat, + see HPI Cardiovascular: no CP/no SOB/no palpitations/no leg swelling Respiratory: no cough/no SOB/no wheezing Gastrointestinal: no N/no V/no D/no C/no acid reflux Musculoskeletal: no muscle aches/no joint aches Skin: no rashes, no hair loss Neurological: no tremors/no numbness/no tingling/no dizziness  I reviewed pt's medications, allergies, PMH, social hx, family hx, and changes were documented in the history of present illness. Otherwise, unchanged from my initial visit note.  Past Medical History:  Diagnosis Date  . Adenomatous polyp of colon 2008  . Arthritis    hands & knees  . Atrophic vaginitis    in past  . Lichen sclerosus    Per pt, denies this   .  Post-operative nausea and vomiting    no vomiting   Past Surgical History:  Procedure Laterality Date  . THYROIDECTOMY  06/04/15   At [facility]  . TONSILLECTOMY AND ADENOIDECTOMY  childhood   Social History   Socioeconomic History  . Marital status: Married    Spouse name: Not on file  . Number of children: 2  . Years of education: Not on file  . Highest education level: Not on file  Occupational History  . Not on file  Tobacco Use  . Smoking status: Never  Smoker  . Smokeless tobacco: Never Used  Substance and Sexual Activity  . Alcohol use: No    Alcohol/week: 0.0 standard drinks  . Drug use: No  . Sexual activity: Never    Partners: Male    Birth control/protection: Other-see comments    Comment: husband vasectomy  Other Topics Concern  . Not on file  Social History Narrative  . Not on file   Social Determinants of Health   Financial Resource Strain:   . Difficulty of Paying Living Expenses:   Food Insecurity:   . Worried About Charity fundraiser in the Last Year:   . Arboriculturist in the Last Year:   Transportation Needs:   . Film/video editor (Medical):   Marland Kitchen Lack of Transportation (Non-Medical):   Physical Activity:   . Days of Exercise per Week:   . Minutes of Exercise per Session:   Stress:   . Feeling of Stress :   Social Connections:   . Frequency of Communication with Friends and Family:   . Frequency of Social Gatherings with Friends and Family:   . Attends Religious Services:   . Active Member of Clubs or Organizations:   . Attends Archivist Meetings:   Marland Kitchen Marital Status:   Intimate Partner Violence:   . Fear of Current or Ex-Partner:   . Emotionally Abused:   Marland Kitchen Physically Abused:   . Sexually Abused:    Current Outpatient Medications on File Prior to Visit  Medication Sig Dispense Refill  . cetirizine (ZYRTEC) 10 MG tablet Take 10 mg by mouth as needed.     . cholecalciferol (VITAMIN D) 1000 UNITS tablet Take 1,000 Units by mouth daily. Vit d 3 1000 units-Take 2 daily    . erythromycin ophthalmic ointment     . lisinopril (PRINIVIL,ZESTRIL) 10 MG tablet Take 10 mg by mouth daily.    Marland Kitchen losartan-hydrochlorothiazide (HYZAAR) 50-12.5 MG tablet     . SYNTHROID 112 MCG tablet Take 1 tablet daily before breakfast. 90 tablet 1   No current facility-administered medications on file prior to visit.   Allergies  Allergen Reactions  . Codeine Nausea And Vomiting    fainting  .  Neomycin-Bacitracin Zn-Polymyx Other (See Comments)    Blisters skin  . Sulfa Antibiotics     Caused nausea   Family History  Problem Relation Age of Onset  . Thyroid disease Mother   . Osteoarthritis Mother   . Cancer Father        prostate & spread to lungs  . Thyroid disease Maternal Grandmother        goiter  . Breast cancer Neg Hx    PE: BP (!) 150/80   Pulse 100   Ht 4' 10.5" (1.486 m)   Wt 162 lb (73.5 kg)   LMP 07/04/1998   SpO2 97%   BMI 33.28 kg/m  Wt Readings from Last 3 Encounters:  09/12/19 162 lb (  73.5 kg)  07/13/18 162 lb (73.5 kg)  07/13/17 162 lb 4.8 oz (73.6 kg)   Constitutional: overweight, in NAD Eyes: PERRLA, EOMI, no exophthalmos ENT: moist mucous membranes, no neck masses palpable, no cervical lymphadenopathy Cardiovascular: tachycardia, RR, No MRG Respiratory: CTA B Gastrointestinal: abdomen soft, NT, ND, BS+ Musculoskeletal: no deformities, strength intact in all 4 Skin: moist, warm, no rashes Neurological: no tremor with outstretched hands, DTR normal in all 4  ASSESSMENT: 1. H/o MNG - FLUS x 2 in RLL thyroid nodule - now s/p total thyroidectomy at [facility]  2. Postsurgical hypothyroidism  3.  History of postop hypocalcemia  PLAN: 1.  - Pt with a history of multinodular goiter, with one nodule biopsy being inconclusive x2 in the past, after which we proceeded with total thyroidectomy in 06/2015.  Final pathology was benign. -She denies neck compression symptoms; has no neck masses on palpation  2. Postsurgical hypothyroidism -In the past, she had a rash possibly from levothyroxine so we switched to brand name Synthroid.  The rash slowly resolved.  It is unclear whether this was caused by levothyroxine or not - latest thyroid labs reviewed with pt >> normal: Lab Results  Component Value Date   TSH 0.60 01/17/2019   - she continues on Synthroid d.a.w. 112 mcg daily - pt feels good on this dose. - we discussed about taking the thyroid  hormone every day, with water, >30 minutes before breakfast, separated by >4 hours from acid reflux medications, calcium, iron, multivitamins. Pt. is taking it correctly. - will check thyroid tests today: TSH and fT4 - If labs are abnormal, she will need to return for repeat TFTs in 1.5 months  3. H/o postop hypocalcemia -This has resolved -She had constipation with 600 mg calcium daily in the past so we stopped the supplement -Calcium level was normal at last check in 07/2017 -We will repeat this today -She does not have perioral numbness or acral cramping  Office Visit on 09/12/2019  Component Date Value Ref Range Status  . TSH 09/12/2019 1.08  0.35 - 4.50 uIU/mL Final  . Free T4 09/12/2019 1.39  0.60 - 1.60 ng/dL Final   Comment: Specimens from patients who are undergoing biotin therapy and /or ingesting biotin supplements may contain high levels of biotin.  The higher biotin concentration in these specimens interferes with this Free T4 assay.  Specimens that contain high levels  of biotin may cause false high results for this Free T4 assay.  Please interpret results in light of the total clinical presentation of the patient.    . Calcium 09/12/2019 9.4  8.4 - 10.5 mg/dL Final   Thyroid test and calcium are excellent.  Philemon Kingdom, MD PhD [facility] Endocrinology

## 2019-09-12 NOTE — Patient Instructions (Signed)
Please stop at the lab. ? ?Please stay on the Levothyroxine 112 mcg. ? ?Take the thyroid hormone every day, with water, at least 30 minutes before breakfast, separated by at least 4 hours from: ?- acid reflux medications ?- calcium ?- iron ?- multivitamins ? ?Please return in 6 months for labs and in 1 year for a visit. ?

## 2019-09-17 ENCOUNTER — Telehealth: Payer: Self-pay

## 2019-09-17 NOTE — Telephone Encounter (Signed)
-----   Message from Philemon Kingdom, MD sent at 09/12/2019  5:03 PM EST ----- [name], can you please call pt: Her thyroid test and calcium are excellent!

## 2019-09-18 NOTE — Telephone Encounter (Signed)
Notified patient of message from Dr. Gherghe, patient expressed understanding and agreement. No further questions.  

## 2020-01-29 ENCOUNTER — Other Ambulatory Visit: Payer: Self-pay | Admitting: Family Medicine

## 2020-01-29 DIAGNOSIS — Z1231 Encounter for screening mammogram for malignant neoplasm of breast: Secondary | ICD-10-CM

## 2020-02-26 ENCOUNTER — Ambulatory Visit: Payer: Medicare Other

## 2020-03-12 ENCOUNTER — Other Ambulatory Visit (INDEPENDENT_AMBULATORY_CARE_PROVIDER_SITE_OTHER): Payer: Medicare Other

## 2020-03-12 ENCOUNTER — Other Ambulatory Visit: Payer: Self-pay

## 2020-03-12 ENCOUNTER — Telehealth: Payer: Self-pay

## 2020-03-12 DIAGNOSIS — E890 Postprocedural hypothyroidism: Secondary | ICD-10-CM

## 2020-03-12 LAB — TSH: TSH: 2.35 u[IU]/mL (ref 0.35–4.50)

## 2020-03-12 LAB — T4, FREE: Free T4: 1.27 ng/dL (ref 0.60–1.60)

## 2020-03-12 NOTE — Telephone Encounter (Signed)
-----   Message from Philemon Kingdom, MD sent at 03/12/2020  4:24 PM EDT ----- [name], can you please call pt: Thyroid tests are normal.  Please continue the current dose of levothyroxine.

## 2020-03-12 NOTE — Telephone Encounter (Signed)
Normal result letter mailed.

## 2020-03-13 ENCOUNTER — Other Ambulatory Visit: Payer: Medicare Other

## 2020-04-01 ENCOUNTER — Ambulatory Visit: Payer: Medicare Other

## 2020-04-21 ENCOUNTER — Ambulatory Visit
Admission: RE | Admit: 2020-04-21 | Discharge: 2020-04-21 | Disposition: A | Discharge status: Home / Self Care | Payer: Medicare Other | Source: Ambulatory Visit | Attending: Family Medicine | Admitting: Family Medicine

## 2020-04-21 ENCOUNTER — Other Ambulatory Visit: Payer: Self-pay

## 2020-04-21 DIAGNOSIS — Z1231 Encounter for screening mammogram for malignant neoplasm of breast: Secondary | ICD-10-CM

## 2020-04-22 ENCOUNTER — Other Ambulatory Visit: Payer: Self-pay | Admitting: Internal Medicine

## 2020-05-02 ENCOUNTER — Ambulatory Visit: Payer: Medicare Other | Attending: Internal Medicine

## 2020-05-02 ENCOUNTER — Other Ambulatory Visit: Payer: Self-pay

## 2020-05-02 DIAGNOSIS — Z23 Encounter for immunization: Secondary | ICD-10-CM

## 2020-05-02 NOTE — Progress Notes (Signed)
   Covid-19 Vaccination Clinic  Name:  Vanessa Clements    MRN: [id number] DOB: 12-07-1935  05/02/2020  Vanessa Clements was observed post Covid-19 immunization for 15 minutes without incident. She was provided with Vaccine Information Sheet and instruction to access the V-Safe system.   Vanessa Clements was instructed to call 911 with any severe reactions post vaccine: Marland Kitchen Difficulty breathing  . Swelling of face and throat  . A fast heartbeat  . A bad rash all over body  . Dizziness and weakness

## 2020-08-12 DIAGNOSIS — J0100 Acute maxillary sinusitis, unspecified: Secondary | ICD-10-CM | POA: Diagnosis not present

## 2020-08-26 ENCOUNTER — Encounter: Payer: Self-pay | Admitting: Podiatry

## 2020-08-26 ENCOUNTER — Other Ambulatory Visit: Payer: Self-pay

## 2020-08-26 ENCOUNTER — Ambulatory Visit: Payer: Medicare Other | Admitting: Podiatry

## 2020-08-26 DIAGNOSIS — L600 Ingrowing nail: Secondary | ICD-10-CM

## 2020-08-26 DIAGNOSIS — M21619 Bunion of unspecified foot: Secondary | ICD-10-CM

## 2020-08-26 NOTE — Patient Instructions (Signed)

## 2020-08-30 NOTE — Progress Notes (Signed)
Subjective:   Patient ID: Vanessa Clements, female   DOB: 85 y.o.   MRN: [id number]   HPI Patient presents stating that she has an ingrown toenail of the left big toe and that its been hurting her and making it hard to walk.  States that she tried wider shoes and other modalities without relief and it is gradually becoming more of an irritation for her.  Patient does not smoke and does like to be active if possible   Review of Systems  All other systems reviewed and are negative.       Objective:  Physical Exam Vitals and nursing note reviewed.  Constitutional:      Appearance: She is well-developed and well-nourished.  Cardiovascular:     Pulses: Intact distal pulses.  Pulmonary:     Effort: Pulmonary effort is normal.  Musculoskeletal:        General: Normal range of motion.  Skin:    General: Skin is warm.  Neurological:     Mental Status: She is alert.     Neurovascular status intact muscle strength found to be within normal limits range of motion adequate subtalar midtarsal joint.  Patient is noted to have incurvated left hallux medial border painful when pressed with no redness no active drainage noted but structural changes to the nailbed itself     Assessment:  Ingrown toenail deformity left hallux medial border with pain with patient who has good digital perfusion well oriented     Plan:  H&P reviewed condition recommended correction of deformity.  Explained procedure risk and patient wants surgery and today I went ahead and I infiltrated the right hallux 60 mg like Marcaine mixture sterile prep done allowing her first to read consent form understanding risk and then went ahead and remove the border exposed matrix applied phenol 3 applications 30 seconds followed by alcohol lavage and sterile dressing.  Patient wants to follow directions which we reviewed and she will leave dressing on 24 hours but take it off earlier if any throbbing were to occur and is encouraged  to call questions concerns

## 2020-09-02 ENCOUNTER — Telehealth: Payer: Self-pay | Admitting: *Deleted

## 2020-09-02 NOTE — Telephone Encounter (Signed)
Called patient and gave instructions per Dr Paulla Dolly, verbalized understanding.

## 2020-09-02 NOTE — Telephone Encounter (Signed)
Patient is wanting to know how long should she be soaking ingrown procedural toe and wearing a bandage and should she be applying any type of ointment? Please advise.

## 2020-09-02 NOTE — Telephone Encounter (Signed)
Usually 2-4 weeks. Can stop any ointment

## 2020-09-15 ENCOUNTER — Encounter: Payer: Self-pay | Admitting: Internal Medicine

## 2020-09-15 ENCOUNTER — Other Ambulatory Visit: Payer: Self-pay

## 2020-09-15 ENCOUNTER — Ambulatory Visit: Payer: Medicare Other | Admitting: Internal Medicine

## 2020-09-15 VITALS — BP 130/86 | HR 80 | Ht 60.0 in | Wt 162.0 lb

## 2020-09-15 DIAGNOSIS — E890 Postprocedural hypothyroidism: Secondary | ICD-10-CM

## 2020-09-15 LAB — TSH: TSH: 1.19 u[IU]/mL (ref 0.35–4.50)

## 2020-09-15 NOTE — Patient Instructions (Signed)
Please stop at the lab. ? ?Please stay on the Levothyroxine 112 mcg. ? ?Take the thyroid hormone every day, with water, at least 30 minutes before breakfast, separated by at least 4 hours from: ?- acid reflux medications ?- calcium ?- iron ?- multivitamins ? ?Please return in 6 months for labs and in 1 year for a visit. ?

## 2020-09-15 NOTE — Progress Notes (Signed)
Patient ID: Vanessa Clements, female   DOB: [birth year], 85 y.o.   MRN: [id number]  This visit occurred during the SARS-CoV-2 public health emergency.  Safety protocols were in place, including screening questions prior to the visit, additional usage of staff PPE, and extensive cleaning of exam room while observing appropriate contact time as indicated for disinfecting solutions.   HPI  Vanessa Clements is a 85 y.o.-year-old female, initially referred by her ENT dr, Dr Dagmar Hait, now returning for f/u for postsurgical hypothyroidism after total thyroidectomy for MNG. Last visit 1 year ago.  Interim history: Since last visit, she has been trying to lose weight, but without success.  She is eating small portions.  She is trying to stay active but does not get out at all due to COVID-19 pandemic. She continues to have knee pain. No other complaints.  Reviewed history: Pt has a h/o MNG, with 2x FLUS biopsies >> thyroidectomy >> benign pathology. She had total thyroidectomy at [facility] (Dr Eliezer Champagne) on 06/04/2015 - had a great experience! She feels well after the surgery, w/o fatigue.   However, at last visit, she had the rash, and we had to switch to 50 g levothyroxine tablets since they did not contain coloring agents or other aches and pains >> 50 g 2 tablets +1/4 tablet.  Rash was still present.  Therefore, we changed back to 112 mcg daily levothyroxine.  Her rash resolved after switching to Synthroid d.a.w.  Reviewed her TFTs: Lab Results  Component Value Date   TSH 2.35 03/12/2020   TSH 1.08 09/12/2019   TSH 0.60 01/17/2019   TSH 3.25 07/13/2018   TSH 1.93 01/10/2018   TSH 2.46 07/13/2017   FREET4 1.27 03/12/2020   FREET4 1.39 09/12/2019   FREET4 1.47 07/13/2018   FREET4 1.40 01/10/2018   FREET4 1.31 07/13/2017   FREET4 1.31 03/13/2017  04/2014: TSH normal by Dr. Ignacia Bayley (ENT)  Pt is on Synthroid d.a.w. 112 mcg daily, taken: - in am (5 am) - fasting - at least 2h  from b'fast - no Ca, Fe, MVI, PPIs - not on Biotin  At last visit she was on calcium 600 mg daily but this caused constipation.  We stopped the supplement.  She had transient hypocalcemia after the surgery but this resolved: 05/06/2020: corrected Ca 9.46  Lab Results  Component Value Date   CALCIUM 9.4 09/12/2019   Vit D levels: No results found for: VD25OH   Pt denies: - feeling nodules in neck - hoarseness - dysphagia - choking - SOB with lying down  Pt does have a FH of thyroid ds.: GM with goiter, mother with mild hypothyroidism. No FH of thyroid cancer. No h/o radiation tx to head or neck.  No seaweed or kelp. No recent contrast studies. No herbal supplements. No Biotin use. No recent steroids use.   History of gel injections in knees.  ROS: Constitutional: no weight gain/no weight loss, no fatigue, no subjective hyperthermia, no subjective hypothermia Eyes: no blurry vision, no xerophthalmia ENT: no sore throat, + see HPI Cardiovascular: no CP/no SOB/no palpitations/no leg swelling Respiratory: no cough/no SOB/no wheezing Gastrointestinal: no N/no V/no D/no C/no acid reflux Musculoskeletal: no muscle aches/no joint aches Skin: no rashes, no hair loss Neurological: no tremors/no numbness/no tingling/no dizziness  I reviewed pt's medications, allergies, PMH, social hx, family hx, and changes were documented in the history of present illness. Otherwise, unchanged from my initial visit note.  Past Medical History:  Diagnosis Date  . Adenomatous  polyp of colon 2008  . Arthritis    hands & knees  . Atrophic vaginitis    in past  . Lichen sclerosus    Per pt, denies this   . Post-operative nausea and vomiting    no vomiting   Past Surgical History:  Procedure Laterality Date  . THYROIDECTOMY  06/04/15   At [facility]  . TONSILLECTOMY AND ADENOIDECTOMY  childhood   Social History   Socioeconomic History  . Marital status: Married    Spouse name: Not on file  .  Number of children: 2  . Years of education: Not on file  . Highest education level: Not on file  Occupational History  . Not on file  Tobacco Use  . Smoking status: Never Smoker  . Smokeless tobacco: Never Used  Substance and Sexual Activity  . Alcohol use: No    Alcohol/week: 0.0 standard drinks  . Drug use: No  . Sexual activity: Never    Partners: Male    Birth control/protection: Other-see comments    Comment: husband vasectomy  Other Topics Concern  . Not on file  Social History Narrative  . Not on file   Social Determinants of Health   Financial Resource Strain: Not on file  Food Insecurity: Not on file  Transportation Needs: Not on file  Physical Activity: Not on file  Stress: Not on file  Social Connections: Not on file  Intimate Partner Violence: Not on file   Current Outpatient Medications on File Prior to Visit  Medication Sig Dispense Refill  . cetirizine (ZYRTEC) 10 MG tablet Take 10 mg by mouth as needed.     . cholecalciferol (VITAMIN D) 1000 UNITS tablet Take 1,000 Units by mouth daily. Vit d 3 1000 units-Take 2 daily    . erythromycin ophthalmic ointment     . lisinopril (PRINIVIL,ZESTRIL) 10 MG tablet Take 10 mg by mouth daily.    Marland Kitchen losartan-hydrochlorothiazide (HYZAAR) 50-12.5 MG tablet     . SYNTHROID 112 MCG tablet TAKE 1 TABLET BY MOUTH DAILY BEFORE BREAKFAST 90 tablet 1   No current facility-administered medications on file prior to visit.   Allergies  Allergen Reactions  . Codeine Nausea And Vomiting    fainting  . Neomycin-Bacitracin Zn-Polymyx Other (See Comments)    Blisters skin  . Sulfa Antibiotics     Caused nausea   Family History  Problem Relation Age of Onset  . Thyroid disease Mother   . Osteoarthritis Mother   . Cancer Father        prostate & spread to lungs  . Thyroid disease Maternal Grandmother        goiter  . Breast cancer Neg Hx    PE: BP 130/86   Pulse 80   Ht 5' (1.524 m)   Wt 162 lb (73.5 kg)   LMP  07/04/1998   SpO2 99%   BMI 31.64 kg/m  Wt Readings from Last 3 Encounters:  09/15/20 162 lb (73.5 kg)  09/12/19 162 lb (73.5 kg)  07/13/18 162 lb (73.5 kg)   Constitutional: overweight, in NAD Eyes: PERRLA, EOMI, no exophthalmos ENT: moist mucous membranes, no neck masses palpable, no cervical lymphadenopathy Cardiovascular: tachycardia, RR, No MRG Respiratory: CTA B Gastrointestinal: abdomen soft, NT, ND, BS+ Musculoskeletal: no deformities, strength intact in all 4 Skin: moist, warm, no rashes Neurological: no tremor with outstretched hands, DTR normal in all 4  ASSESSMENT: 1. H/o MNG - FLUS x 2 in RLL thyroid nodule - now s/p total  thyroidectomy at [facility]  2. Postsurgical hypothyroidism  3.  History of postop hypocalcemia  PLAN: 1.  - Pt with a history of multinodular goiter with 1 nodule biopsy being inconclusive x2 in the past, after which he proceeded with total thyroidectomy in 06/2015.  Final pathology was benign. -She denies neck compression symptoms and has no neck masses on palpation  2. Postsurgical hypothyroidism -In the past, she had a rash possibly from levothyroxine so we switched to brand name Synthroid.  The rash slowly resolved.  It is unclear whether this was caused by levothyroxine or not - latest thyroid labs reviewed with pt >> normal: Lab Results  Component Value Date   TSH 2.35 03/12/2020   - she continues on Synthroid 112 mcg daily - pt feels good on this dose, but still has difficulty losing weight.  Her weight has been stable in the last several years. - we discussed about taking the thyroid hormone every day, with water, >30 minutes before breakfast, separated by >4 hours from acid reflux medications, calcium, iron, multivitamins. Pt. is taking it correctly. - will check thyroid tests today: TSH and fT4 - If labs are abnormal, she will need to return for repeat TFTs in 1.5 months  3. H/o postop hypocalcemia -Now resolved -She had constipation  with a 600 mg calcium tablets in the past so we stopped the supplement -Calcium was normal at last check, in 05/2020 -We will not repeat this today -She denies perioral numbness or acral cramping  Needs refills.  Component     Latest Ref Rng & Units 09/15/2020  T4,Free(Direct)     0.60 - 1.60 ng/dL 1.28  TSH     0.35 - 4.50 uIU/mL 1.19  TFTs are excellent.  Philemon Kingdom, MD PhD [facility]

## 2020-09-16 ENCOUNTER — Encounter: Payer: Self-pay | Admitting: Internal Medicine

## 2020-09-16 ENCOUNTER — Telehealth: Payer: Self-pay

## 2020-09-16 LAB — T4, FREE: Free T4: 1.28 ng/dL (ref 0.60–1.60)

## 2020-09-16 MED ORDER — SYNTHROID 112 MCG PO TABS
ORAL_TABLET | ORAL | 3 refills | Status: DC
Start: 2020-09-16 — End: 2021-09-16

## 2020-09-16 NOTE — Telephone Encounter (Signed)
-----   Message from Philemon Kingdom, MD sent at 09/16/2020  9:45 AM EDT ----- Can you please call pt.: Her thyroid tests are excellent.  I refilled her Synthroid.

## 2020-09-16 NOTE — Telephone Encounter (Signed)
Called and advised pt of results. Pt verbalized understanding.

## 2020-11-04 DIAGNOSIS — Z1389 Encounter for screening for other disorder: Secondary | ICD-10-CM | POA: Diagnosis not present

## 2020-11-04 DIAGNOSIS — Z1159 Encounter for screening for other viral diseases: Secondary | ICD-10-CM | POA: Diagnosis not present

## 2020-11-04 DIAGNOSIS — I10 Essential (primary) hypertension: Secondary | ICD-10-CM | POA: Diagnosis not present

## 2020-11-04 DIAGNOSIS — M170 Bilateral primary osteoarthritis of knee: Secondary | ICD-10-CM | POA: Diagnosis not present

## 2020-11-04 DIAGNOSIS — E785 Hyperlipidemia, unspecified: Secondary | ICD-10-CM | POA: Diagnosis not present

## 2020-11-04 DIAGNOSIS — E890 Postprocedural hypothyroidism: Secondary | ICD-10-CM | POA: Diagnosis not present

## 2020-11-04 DIAGNOSIS — Z0000 Encounter for general adult medical examination without abnormal findings: Secondary | ICD-10-CM | POA: Diagnosis not present

## 2021-03-18 ENCOUNTER — Other Ambulatory Visit: Payer: Self-pay

## 2021-03-18 ENCOUNTER — Other Ambulatory Visit (INDEPENDENT_AMBULATORY_CARE_PROVIDER_SITE_OTHER): Payer: Medicare Other

## 2021-03-18 ENCOUNTER — Other Ambulatory Visit: Payer: Self-pay | Admitting: Internal Medicine

## 2021-03-18 DIAGNOSIS — E890 Postprocedural hypothyroidism: Secondary | ICD-10-CM | POA: Diagnosis not present

## 2021-03-18 LAB — TSH: TSH: 3.12 u[IU]/mL (ref 0.35–5.50)

## 2021-03-18 LAB — T4, FREE: Free T4: 1.21 ng/dL (ref 0.60–1.60)

## 2021-04-05 ENCOUNTER — Other Ambulatory Visit: Payer: Self-pay | Admitting: Family Medicine

## 2021-04-05 DIAGNOSIS — Z1231 Encounter for screening mammogram for malignant neoplasm of breast: Secondary | ICD-10-CM

## 2021-05-03 ENCOUNTER — Ambulatory Visit: Payer: Medicare Other

## 2021-05-12 DIAGNOSIS — E785 Hyperlipidemia, unspecified: Secondary | ICD-10-CM | POA: Diagnosis not present

## 2021-05-12 DIAGNOSIS — M170 Bilateral primary osteoarthritis of knee: Secondary | ICD-10-CM | POA: Diagnosis not present

## 2021-05-12 DIAGNOSIS — E890 Postprocedural hypothyroidism: Secondary | ICD-10-CM | POA: Diagnosis not present

## 2021-05-12 DIAGNOSIS — I10 Essential (primary) hypertension: Secondary | ICD-10-CM | POA: Diagnosis not present

## 2021-06-03 ENCOUNTER — Ambulatory Visit: Payer: Medicare Other

## 2021-06-14 ENCOUNTER — Ambulatory Visit: Payer: Medicare Other

## 2021-07-20 ENCOUNTER — Ambulatory Visit
Admission: RE | Admit: 2021-07-20 | Discharge: 2021-07-20 | Disposition: A | Discharge status: Home / Self Care | Payer: Medicare Other | Source: Ambulatory Visit | Attending: Family Medicine | Admitting: Family Medicine

## 2021-07-20 DIAGNOSIS — Z1231 Encounter for screening mammogram for malignant neoplasm of breast: Secondary | ICD-10-CM | POA: Diagnosis not present

## 2021-08-03 DIAGNOSIS — R58 Hemorrhage, not elsewhere classified: Secondary | ICD-10-CM | POA: Diagnosis not present

## 2021-08-03 DIAGNOSIS — D225 Melanocytic nevi of trunk: Secondary | ICD-10-CM | POA: Diagnosis not present

## 2021-08-03 DIAGNOSIS — Z08 Encounter for follow-up examination after completed treatment for malignant neoplasm: Secondary | ICD-10-CM | POA: Diagnosis not present

## 2021-08-03 DIAGNOSIS — L570 Actinic keratosis: Secondary | ICD-10-CM | POA: Diagnosis not present

## 2021-08-03 DIAGNOSIS — L718 Other rosacea: Secondary | ICD-10-CM | POA: Diagnosis not present

## 2021-08-03 DIAGNOSIS — L821 Other seborrheic keratosis: Secondary | ICD-10-CM | POA: Diagnosis not present

## 2021-08-03 DIAGNOSIS — L814 Other melanin hyperpigmentation: Secondary | ICD-10-CM | POA: Diagnosis not present

## 2021-08-03 DIAGNOSIS — L538 Other specified erythematous conditions: Secondary | ICD-10-CM | POA: Diagnosis not present

## 2021-08-03 DIAGNOSIS — L720 Epidermal cyst: Secondary | ICD-10-CM | POA: Diagnosis not present

## 2021-08-03 DIAGNOSIS — L820 Inflamed seborrheic keratosis: Secondary | ICD-10-CM | POA: Diagnosis not present

## 2021-08-03 DIAGNOSIS — Z85828 Personal history of other malignant neoplasm of skin: Secondary | ICD-10-CM | POA: Diagnosis not present

## 2021-08-19 DIAGNOSIS — H903 Sensorineural hearing loss, bilateral: Secondary | ICD-10-CM | POA: Diagnosis not present

## 2021-09-15 ENCOUNTER — Ambulatory Visit: Payer: Medicare Other | Admitting: Internal Medicine

## 2021-09-15 ENCOUNTER — Other Ambulatory Visit: Payer: Self-pay

## 2021-09-15 ENCOUNTER — Encounter: Payer: Self-pay | Admitting: Internal Medicine

## 2021-09-15 VITALS — BP 138/80 | HR 79 | Ht 60.0 in | Wt 160.8 lb

## 2021-09-15 DIAGNOSIS — Z9889 Other specified postprocedural states: Secondary | ICD-10-CM

## 2021-09-15 DIAGNOSIS — E890 Postprocedural hypothyroidism: Secondary | ICD-10-CM

## 2021-09-15 LAB — T4, FREE: Free T4: 1.2 ng/dL (ref 0.60–1.60)

## 2021-09-15 LAB — TSH: TSH: 3.11 u[IU]/mL (ref 0.35–5.50)

## 2021-09-15 NOTE — Patient Instructions (Signed)
Please stop at the lab. ? ?Please stay on the Levothyroxine 112 mcg. ? ?Take the thyroid hormone every day, with water, at least 30 minutes before breakfast, separated by at least 4 hours from: ?- acid reflux medications ?- calcium ?- iron ?- multivitamins ? ?Please return in 6 months for labs and in 1 year for a visit. ?

## 2021-09-15 NOTE — Progress Notes (Signed)
Patient ID: Vanessa Clements, female   DOB: Nov 22, 1935, 86 y.o.   MRN: [id number] ? ?This visit occurred during the SARS-CoV-2 public health emergency.  Safety protocols were in place, including screening questions prior to the visit, additional usage of staff PPE, and extensive cleaning of exam room while observing appropriate contact time as indicated for disinfecting solutions.  ? 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 Last visit 1 year ago. ? ?Interim history: ?Continues to be trying to lose weight but without success.  She mentions that her husband has [place] and has lost weight recently that she has been looking more for him. ?She continues to have knee pain. No recent falls. She wears copper braces. ?She mentions occasional numbness in a particular spot on her lip, mostly when she goes to bed. ? ?Reviewed history: ?Pt has a h/o MNG, with 2x FLUS biopsies >> thyroidectomy >> benign pathology. She had total thyroidectomy at [facility] (Dr Eliezer Champagne) on 06/04/2015 - had a great experience! She feels well after the surgery, w/o fatigue.  ? ?However, at last visit, she had the rash, and we had to switch to 50 ?g levothyroxine tablets since they did not contain coloring agents or other aches and pains >> 50 ?g ?2 tablets +1/4 tablet.  Rash was still present. ? ?Therefore, we changed back to 112 mcg daily levothyroxine. ? ?Her rash resolved after switching to Synthroid d.a.w. ? ?Reviewed her TFTs: ?Lab Results  ?Component Value Date  ? TSH 3.12 03/18/2021  ? TSH 1.19 09/15/2020  ? TSH 2.35 03/12/2020  ? TSH 1.08 09/12/2019  ? TSH 0.60 01/17/2019  ? TSH 3.25 07/13/2018  ? FREET4 1.21 03/18/2021  ? FREET4 1.28 09/15/2020  ? FREET4 1.27 03/12/2020  ? FREET4 1.39 09/12/2019  ? FREET4 1.47 07/13/2018  ? FREET4 1.40 01/10/2018  ?04/2014: TSH normal by Dr. Ignacia Bayley  (ENT) ? ?Pt is on Synthroid d.a.w. 112 mcg daily, taken: ?- in am (5 am) ?- fasting ?- at least 1-2h from b'fast ?- no Ca, Fe, MVI, PPIs ?- not on Biotin ?On vitamin D3. ? ?At last visit she was on calcium 600 mg daily but this caused constipation.  We stopped the supplement. ? ?She had transient hypocalcemia after the surgery but this resolved: ?11/04/2020: corrected calcium 9.22 ?05/06/2020: corrected calcium 9.46  ?Lab Results  ?Component Value Date  ? CALCIUM 9.4 09/12/2019  ? ?Vit D levels: ?No results found for: VD25OH  ? ?Pt denies: ?- feeling nodules in neck ?- hoarseness ?- dysphagia ?- choking ?- SOB with lying down ? ?Pt does have a FH of thyroid ds.: GM with goiter, mother with mild hypothyroidism. No FH of thyroid cancer. No h/o radiation tx to head or neck. ?No herbal supplements. No Biotin use. No recent steroids use.  ? ?She had gel injections in knees. ? ?ROS: ? + see HPI ? ?I reviewed pt's medications, allergies, PMH, social hx, family hx, and changes were documented in the history of present illness. Otherwise, unchanged from my initial visit note. ? ?Past Medical History:  ?Diagnosis Date  ? Adenomatous polyp of colon 2008  ? Arthritis   ? hands & knees  ? Atrophic vaginitis   ? in past  ? Lichen sclerosus   ? Per pt, denies this   ? Post-operative nausea and vomiting   ? no vomiting  ? ?  Past Surgical History:  ?Procedure Laterality Date  ? THYROIDECTOMY  06/04/15  ? At [facility]  ? TONSILLECTOMY AND ADENOIDECTOMY  childhood  ? ?Social History  ? ?Socioeconomic History  ? Marital status: Married  ?  Spouse name: Not on file  ? Number of children: 2  ? Years of education: Not on file  ? Highest education level: Not on file  ?Occupational History  ? Not on file  ?Tobacco Use  ? Smoking status: Never  ? Smokeless tobacco: Never  ?Substance and Sexual Activity  ? Alcohol use: No  ?  Alcohol/week: 0.0 standard drinks  ? Drug use: No  ? Sexual activity: Never  ?  Partners: Male  ?  Birth control/protection:  Other-see comments  ?  Comment: husband vasectomy  ?Other Topics Concern  ? Not on file  ?Social History Narrative  ? Not on file  ? ?Social Determinants of Health  ? ?Financial Resource Strain: Not on file  ?Food Insecurity: Not on file  ?Transportation Needs: Not on file  ?Physical Activity: Not on file  ?Stress: Not on file  ?Social Connections: Not on file  ?Intimate Partner Violence: Not on file  ? ?Current Outpatient Medications on File Prior to Visit  ?Medication Sig Dispense Refill  ? cetirizine (ZYRTEC) 10 MG tablet Take 10 mg by mouth as needed.     ? cholecalciferol (VITAMIN D) 1000 UNITS tablet Take 1,000 Units by mouth daily. Vit d 3 1000 units-Take 2 daily    ? erythromycin ophthalmic ointment     ? lisinopril (PRINIVIL,ZESTRIL) 10 MG tablet Take 10 mg by mouth daily.    ? losartan-hydrochlorothiazide (HYZAAR) 50-12.5 MG tablet     ? SYNTHROID 112 MCG tablet TAKE 1 TABLET BY MOUTH DAILY BEFORE BREAKFAST 90 tablet 3  ? ?No current facility-administered medications on file prior to visit.  ? ?Allergies  ?Allergen Reactions  ? Codeine Nausea And Vomiting  ?  fainting  ? Neomycin-Bacitracin Zn-Polymyx Other (See Comments)  ?  Blisters skin  ? Sulfa Antibiotics   ?  Caused nausea  ? ?Family History  ?Problem Relation Age of Onset  ? Thyroid disease Mother   ? Osteoarthritis Mother   ? Cancer Father   ?     prostate & spread to lungs  ? Thyroid disease Maternal Grandmother   ?     goiter  ? Breast cancer Neg Hx   ? ?PE: ?BP 138/80 (BP Location: Left Arm, Patient Position: Sitting, Cuff Size: Normal)   Pulse 79   Ht 5' (1.524 m)   Wt 160 lb 12.8 oz (72.9 kg)   LMP 07/04/1998   SpO2 99%   BMI 31.40 kg/m?  ?Wt Readings from Last 3 Encounters:  ?09/15/21 160 lb 12.8 oz (72.9 kg)  ?09/15/20 162 lb (73.5 kg)  ?09/12/19 162 lb (73.5 kg)  ? ?Constitutional: overweight, in NAD ?Eyes: PERRLA, EOMI, no exophthalmos ?ENT: moist mucous membranes, no neck masses palpable, no cervical lymphadenopathy ?Cardiovascular:  tachycardia, RR, No MRG ?Respiratory: CTA B ?Musculoskeletal: no deformities, strength intact in all 4 ?Skin: moist, warm, no rashes ?Neurological: no tremor with outstretched hands, DTR normal in all 4 ? ?ASSESSMENT: ?1. H/o MNG ?- FLUS x 2 in RLL thyroid nodule ?- now s/p total thyroidectomy at [facility] ? ?2. Postsurgical hypothyroidism ? ?3.  History of postop hypocalcemia ? ?PLAN: ?1.  ?- Pt with a history of multinodular with 1 nodule bx being inconclusive in the past x2, after which he proceeded with total  thyroidectomy in 06/2015.  Final pathology was benign. ?-She has no neck compression symptoms ?-No neck masses on palpation today ? ?2. Postsurgical hypothyroidism ?-In the past, she had a rash possibly from levothyroxine so we switched to brand name Synthroid.  This rash slowly resolved before last visit.  It was unclear whether this was caused by levothyroxine or not. ?- latest thyroid labs reviewed with pt. >> normal: ?Lab Results  ?Component Value Date  ? TSH 3.12 03/18/2021  ?- she continues on LT4 112 mcg daily ?- pt feels good on this dose. ?- we discussed about taking the thyroid hormone every day, with water, >30 minutes before breakfast, separated by >4 hours from acid reflux medications, calcium, iron, multivitamins. Pt. is taking it correctly. ?- will check thyroid tests today: TSH and fT4 ?- If labs are abnormal, she will need to return for repeat TFTs in 1.5 months ?-Otherwise, I will see her back in a year but with labs in 6 months per her preference. ? ?3. H/o postop hypocalcemia ?-Now resolved ?-She had constipation with 600 mg calcium in the past so we stopped the supplement ?-Calcium was normal at last check in 11/2020.  We will not repeat this today ?-She denies acral cramping but she mentions occasional numbness in a particular spot on her leg when she goes to bed.  This is not new.  I do not feel that this is necessarily related to hypocalcemia but I did advise her to try to take a Tums  tablet whenever this happens to see if it resolves. ? ?Needs refills. ? ?Component ?    Latest Ref Rng & Units 09/15/2021  ?TSH ?    0.35 - 5.50 uIU/mL 3.11  ?T4,Free(Direct) ?    0.60 - 1.60 ng/dL 1.20  ?Normal

## 2021-09-16 MED ORDER — SYNTHROID 112 MCG PO TABS
ORAL_TABLET | ORAL | 3 refills | Status: DC
Start: 2021-09-16 — End: 2022-09-19

## 2021-10-18 DIAGNOSIS — H0102B Squamous blepharitis left eye, upper and lower eyelids: Secondary | ICD-10-CM | POA: Diagnosis not present

## 2021-10-18 DIAGNOSIS — H04123 Dry eye syndrome of bilateral lacrimal glands: Secondary | ICD-10-CM | POA: Diagnosis not present

## 2021-10-18 DIAGNOSIS — H25813 Combined forms of age-related cataract, bilateral: Secondary | ICD-10-CM | POA: Diagnosis not present

## 2021-10-18 DIAGNOSIS — H0102A Squamous blepharitis right eye, upper and lower eyelids: Secondary | ICD-10-CM | POA: Diagnosis not present

## 2021-11-16 DIAGNOSIS — Z0000 Encounter for general adult medical examination without abnormal findings: Secondary | ICD-10-CM | POA: Diagnosis not present

## 2021-11-16 DIAGNOSIS — M170 Bilateral primary osteoarthritis of knee: Secondary | ICD-10-CM | POA: Diagnosis not present

## 2021-11-16 DIAGNOSIS — I10 Essential (primary) hypertension: Secondary | ICD-10-CM | POA: Diagnosis not present

## 2021-11-16 DIAGNOSIS — E785 Hyperlipidemia, unspecified: Secondary | ICD-10-CM | POA: Diagnosis not present

## 2021-11-16 DIAGNOSIS — M810 Age-related osteoporosis without current pathological fracture: Secondary | ICD-10-CM | POA: Diagnosis not present

## 2021-11-16 DIAGNOSIS — E890 Postprocedural hypothyroidism: Secondary | ICD-10-CM | POA: Diagnosis not present

## 2021-12-16 DIAGNOSIS — L820 Inflamed seborrheic keratosis: Secondary | ICD-10-CM | POA: Diagnosis not present

## 2021-12-16 DIAGNOSIS — D485 Neoplasm of uncertain behavior of skin: Secondary | ICD-10-CM | POA: Diagnosis not present

## 2021-12-16 DIAGNOSIS — L821 Other seborrheic keratosis: Secondary | ICD-10-CM | POA: Diagnosis not present

## 2022-01-11 DIAGNOSIS — L814 Other melanin hyperpigmentation: Secondary | ICD-10-CM | POA: Diagnosis not present

## 2022-01-11 DIAGNOSIS — L905 Scar conditions and fibrosis of skin: Secondary | ICD-10-CM | POA: Diagnosis not present

## 2022-01-11 DIAGNOSIS — Z85828 Personal history of other malignant neoplasm of skin: Secondary | ICD-10-CM | POA: Diagnosis not present

## 2022-01-11 DIAGNOSIS — Z08 Encounter for follow-up examination after completed treatment for malignant neoplasm: Secondary | ICD-10-CM | POA: Diagnosis not present

## 2022-03-21 ENCOUNTER — Other Ambulatory Visit: Payer: Self-pay | Admitting: Internal Medicine

## 2022-03-21 ENCOUNTER — Other Ambulatory Visit (INDEPENDENT_AMBULATORY_CARE_PROVIDER_SITE_OTHER): Payer: Medicare Other

## 2022-03-21 DIAGNOSIS — E890 Postprocedural hypothyroidism: Secondary | ICD-10-CM

## 2022-03-21 LAB — T4, FREE: Free T4: 1.28 ng/dL (ref 0.60–1.60)

## 2022-03-21 LAB — TSH: TSH: 4.34 u[IU]/mL (ref 0.35–5.50)

## 2022-05-21 IMAGING — MG DIGITAL SCREENING BILAT W/ TOMO W/ CAD
6 of 12 series · 6 of 36 positions shown · non-contrast
Comparison: Previous exam(s).

CLINICAL DATA: Screening.

EXAM:
DIGITAL SCREENING BILATERAL MAMMOGRAM WITH TOMO AND CAD

[L MLO synth-2D (1 of 2)]
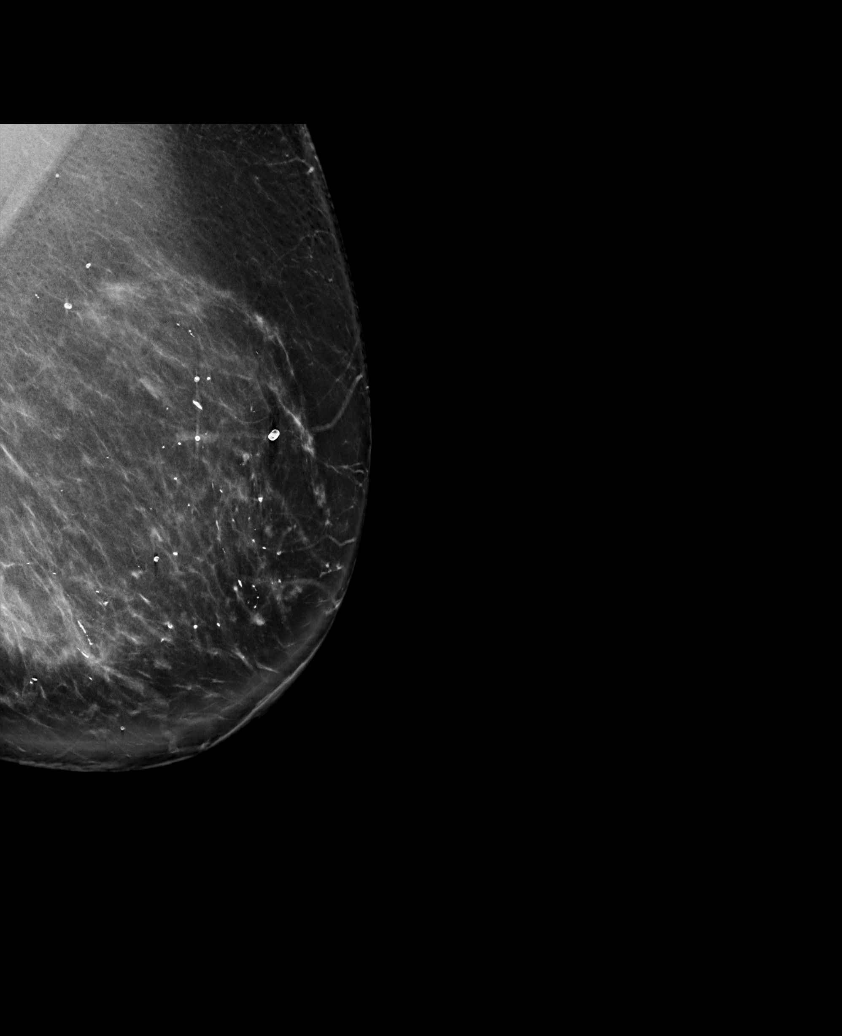

[R MLO synth-2D (1 of 2)]
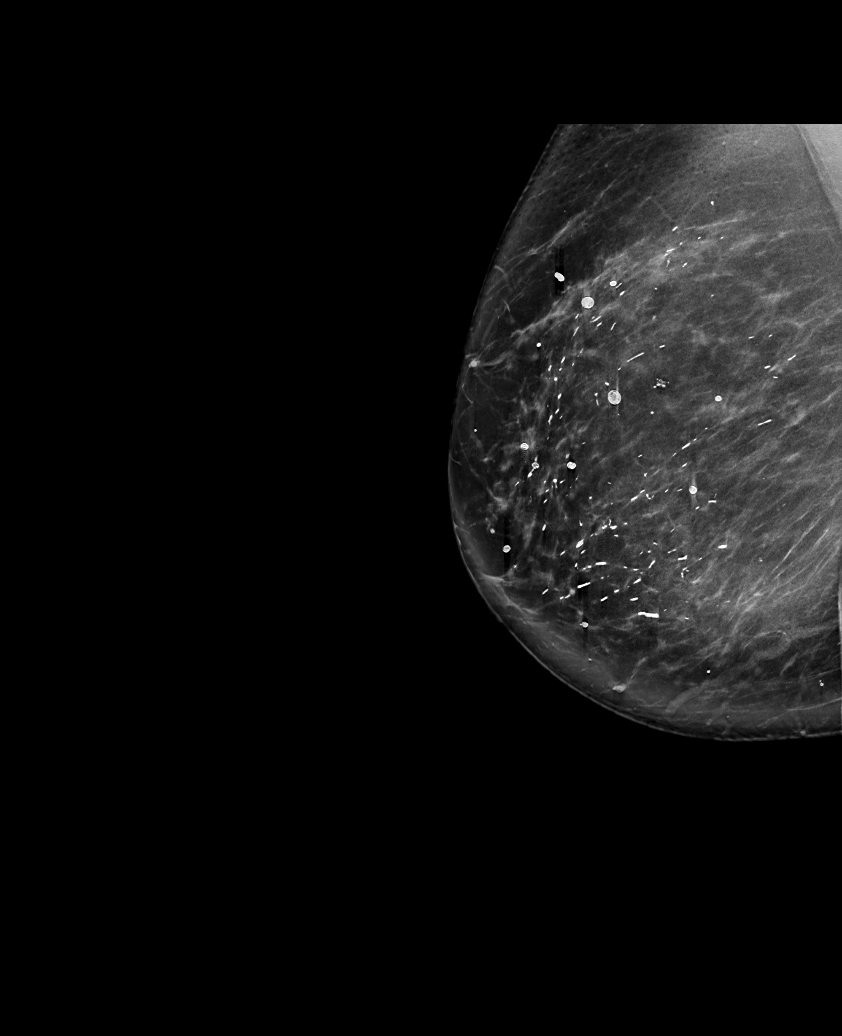

[R MLO synth-2D (2 of 2)]
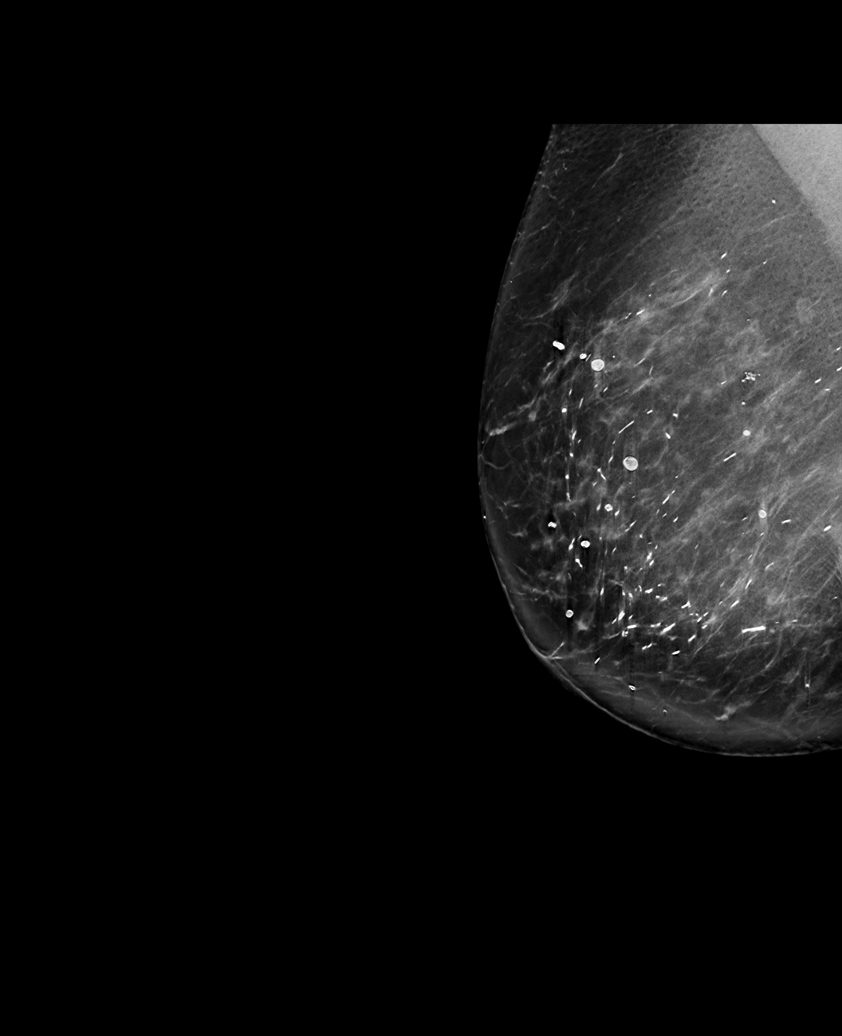

[L MLO synth-2D (2 of 2)]
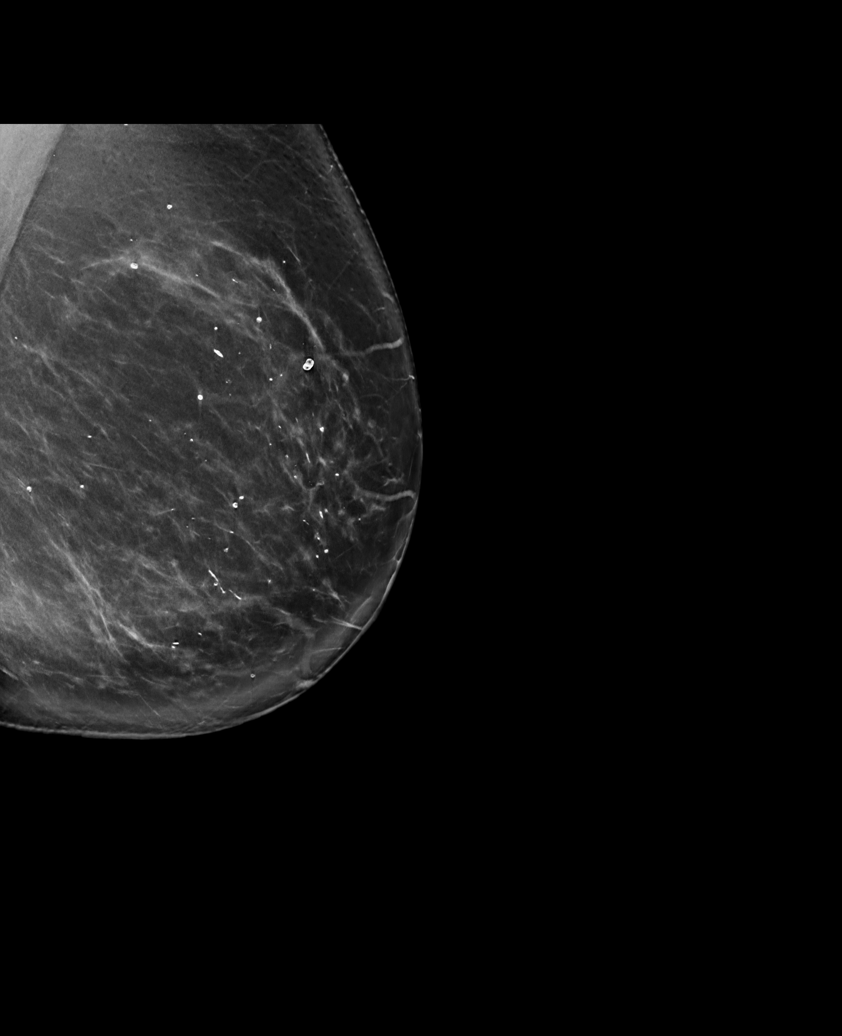

[R CC synth-2D]
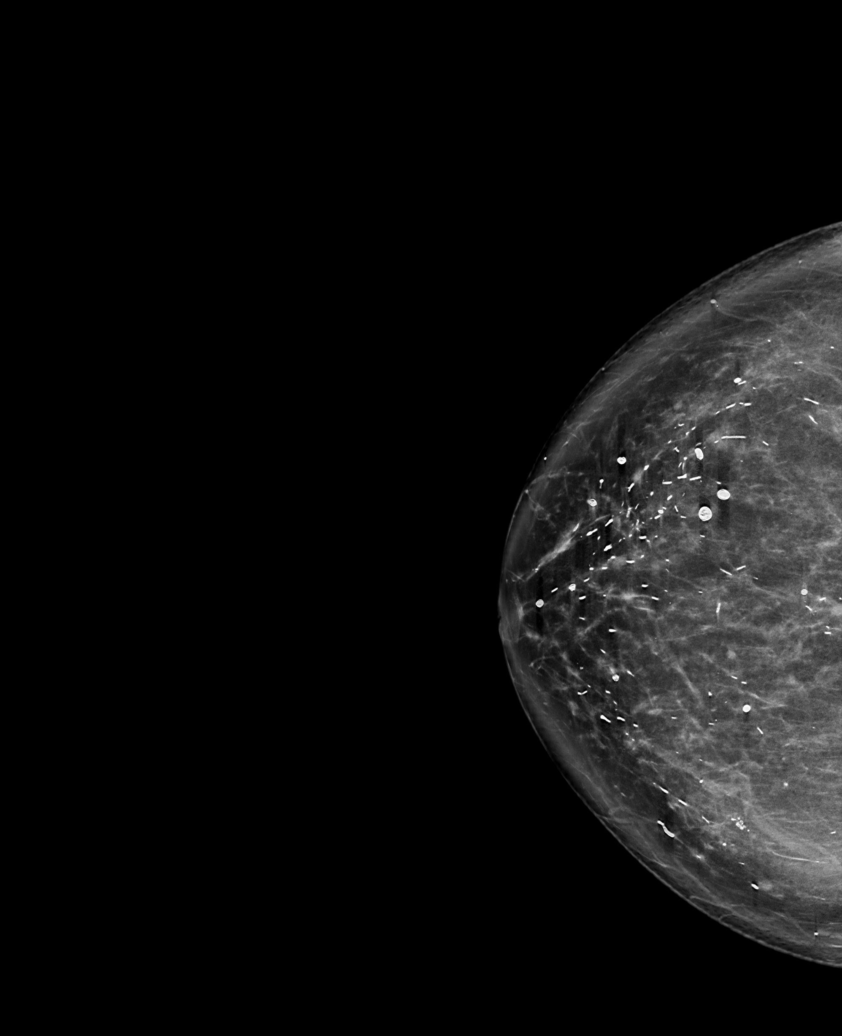

[L CC synth-2D]
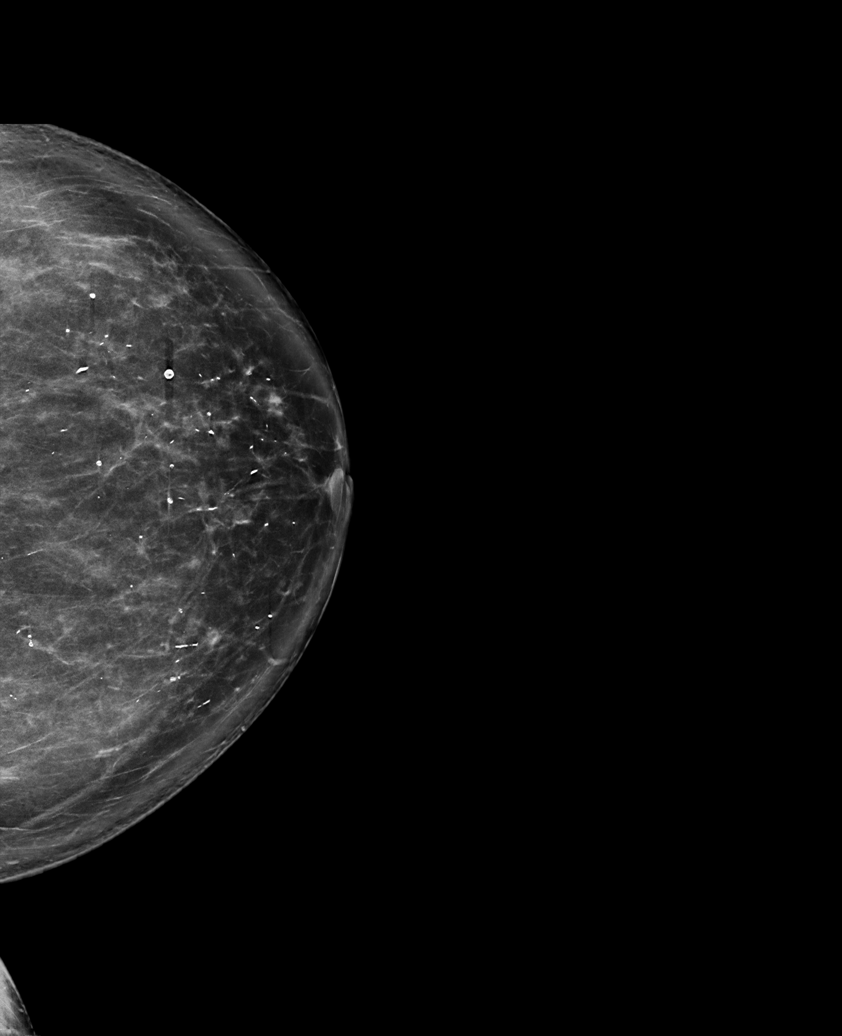

[6 of 36 positions shown; findings below may reference images not displayed]

ACR Breast Density Category b: There are scattered areas of
fibroglandular density.
FINDINGS: There are no findings suspicious for malignancy. Images were
processed with CAD.
IMPRESSION: No mammographic evidence of malignancy. A result letter of this
screening mammogram will be mailed directly to the patient.

RECOMMENDATION:
Screening mammogram in one year. (Code:[id number])

BI-RADS CATEGORY  1: Negative.

## 2022-05-24 DIAGNOSIS — R195 Other fecal abnormalities: Secondary | ICD-10-CM | POA: Diagnosis not present

## 2022-05-24 DIAGNOSIS — I10 Essential (primary) hypertension: Secondary | ICD-10-CM | POA: Diagnosis not present

## 2022-07-07 ENCOUNTER — Other Ambulatory Visit: Payer: Self-pay | Admitting: Family Medicine

## 2022-07-07 DIAGNOSIS — Z1231 Encounter for screening mammogram for malignant neoplasm of breast: Secondary | ICD-10-CM

## 2022-08-03 DIAGNOSIS — Z85828 Personal history of other malignant neoplasm of skin: Secondary | ICD-10-CM | POA: Diagnosis not present

## 2022-08-03 DIAGNOSIS — L821 Other seborrheic keratosis: Secondary | ICD-10-CM | POA: Diagnosis not present

## 2022-08-03 DIAGNOSIS — L814 Other melanin hyperpigmentation: Secondary | ICD-10-CM | POA: Diagnosis not present

## 2022-08-03 DIAGNOSIS — L988 Other specified disorders of the skin and subcutaneous tissue: Secondary | ICD-10-CM | POA: Diagnosis not present

## 2022-08-03 DIAGNOSIS — Z08 Encounter for follow-up examination after completed treatment for malignant neoplasm: Secondary | ICD-10-CM | POA: Diagnosis not present

## 2022-08-03 DIAGNOSIS — D225 Melanocytic nevi of trunk: Secondary | ICD-10-CM | POA: Diagnosis not present

## 2022-08-26 ENCOUNTER — Ambulatory Visit
Admission: RE | Admit: 2022-08-26 | Discharge: 2022-08-26 | Disposition: A | Discharge status: Home / Self Care | Payer: Medicare Other | Source: Ambulatory Visit | Attending: Family Medicine | Admitting: Family Medicine

## 2022-08-26 DIAGNOSIS — Z1231 Encounter for screening mammogram for malignant neoplasm of breast: Secondary | ICD-10-CM

## 2022-09-19 ENCOUNTER — Ambulatory Visit: Payer: Medicare Other | Admitting: Internal Medicine

## 2022-09-19 ENCOUNTER — Encounter: Payer: Self-pay | Admitting: Internal Medicine

## 2022-09-19 VITALS — BP 154/88 | HR 68 | Ht 60.0 in | Wt 144.4 lb

## 2022-09-19 DIAGNOSIS — E890 Postprocedural hypothyroidism: Secondary | ICD-10-CM | POA: Diagnosis not present

## 2022-09-19 LAB — T4, FREE: Free T4: 1.52 ng/dL (ref 0.60–1.60)

## 2022-09-19 LAB — TSH: TSH: 0.41 u[IU]/mL (ref 0.35–5.50)

## 2022-09-19 MED ORDER — SYNTHROID 112 MCG PO TABS: ORAL_TABLET | ORAL | 3 refills | Status: DC

## 2022-09-19 NOTE — Patient Instructions (Signed)
Please stop at the lab.  Please stay on the Levothyroxine 112 mcg.  Take the thyroid hormone every day, with water, at least 30 minutes before breakfast, separated by at least 4 hours from: - acid reflux medications - calcium - iron - multivitamins  Please return in 6 months for labs and in 1 year for a visit. 

## 2022-09-19 NOTE — Progress Notes (Signed)
Patient ID: Vanessa Clements, female   DOB: June 29, 1936, 87 y.o.   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 Last visit 1 year ago.  Interim history: She had occasional numbness in a particular spot on her lip  at last OVs >> resolved. She has dental work >> was not able to eat well >> lost 16 lbs. Her knee pain is much better.  She also feels great after losing the weight.  Reviewed history: Pt has a h/o MNG, with 2x FLUS biopsies >> thyroidectomy >> benign pathology. She had total thyroidectomy at [facility] (Dr Eliezer Champagne) on 06/04/2015 - had a great experience! She feels well after the surgery, w/o fatigue.   However, at last visit, she had the rash, and we had to switch to 50 g levothyroxine tablets since they did not contain coloring agents or other aches and pains >> 50 g 2 tablets +1/4 tablet.  Rash was still present.  Therefore, we changed back to 112 mcg daily levothyroxine.  Her rash resolved after switching to Synthroid d.a.w.  Reviewed her TFTs: Lab Results  Component Value Date   TSH 4.34 03/21/2022   TSH 3.11 09/15/2021   TSH 3.12 03/18/2021   TSH 1.19 09/15/2020   TSH 2.35 03/12/2020   TSH 1.08 09/12/2019   FREET4 1.28 03/21/2022   FREET4 1.20 09/15/2021   FREET4 1.21 03/18/2021   FREET4 1.28 09/15/2020   FREET4 1.27 03/12/2020   FREET4 1.39 09/12/2019  04/2014: TSH normal by Dr. Ignacia Bayley (ENT)  Pt is on Synthroid d.a.w. 112 mcg daily, taken: - in am (5 am) - fasting - at least 1-2h from b'fast - no Ca, Fe, MVI, PPIs - not on Biotin On vitamin D3.  Prev. she was on calcium 600 mg daily but this caused constipation.  We stopped the supplement.  She had transient hypocalcemia after the surgery but this resolved: 11/17/2021: corrected calcium 9.2 (8.6-10.3) 11/04/2020: corrected calcium  9.22 05/06/2020: corrected calcium 9.46  Lab Results  Component Value Date   CALCIUM 9.4 09/12/2019   Vit D levels: No results found for: "VD25OH"   Pt denies: - feeling nodules in neck - hoarseness - dysphagia - choking  Pt does have a FH of thyroid ds.: GM with goiter, mother with mild hypothyroidism. No FH of thyroid cancer. No h/o radiation tx to head or neck. No herbal supplements. No Biotin use. No recent steroids use.   She had gel injections in knees in the past.  ROS:  + see HPI  I reviewed pt's medications, allergies, PMH, social hx, family hx, and changes were documented in the history of present illness. Otherwise, unchanged from my initial visit note.  Past Medical History:  Diagnosis Date   Adenomatous polyp of colon 2008   Arthritis    hands & knees   Atrophic vaginitis    in past   Lichen sclerosus    Per pt, denies this    Post-operative nausea and vomiting    no vomiting   Past Surgical History:  Procedure Laterality Date   THYROIDECTOMY  06/04/15   At [place]  childhood   Social History   Socioeconomic History   Marital status: Married    Spouse name: Not on file   Number of children: 2   Years of education: Not on file  Highest education level: Not on file  Occupational History   Not on file  Tobacco Use   Smoking status: Never   Smokeless tobacco: Never  Substance and Sexual Activity   Alcohol use: No    Alcohol/week: 0.0 standard drinks of alcohol   Drug use: No   Sexual activity: Never    Partners: Male    Birth control/protection: Other-see comments    Comment: husband vasectomy  Other Topics Concern   Not on file  Social History Narrative   Not on file   Social Determinants of Health   Financial Resource Strain: Not on file  Food Insecurity: Not on file  Transportation Needs: Not on file  Physical Activity: Not on file  Stress: Not on file  Social Connections: Not on file  Intimate  Partner Violence: Not on file   Current Outpatient Medications on File Prior to Visit  Medication Sig Dispense Refill   cetirizine (ZYRTEC) 10 MG tablet Take 10 mg by mouth as needed.      cholecalciferol (VITAMIN D) 1000 UNITS tablet Take 1,000 Units by mouth daily. Vit d 3 1000 units-Take 2 daily     erythromycin ophthalmic ointment      lisinopril (PRINIVIL,ZESTRIL) 10 MG tablet Take 10 mg by mouth daily.     losartan-hydrochlorothiazide (HYZAAR) 50-12.5 MG tablet      SYNTHROID 112 MCG tablet TAKE 1 TABLET BY MOUTH DAILY BEFORE BREAKFAST 90 tablet 3   No current facility-administered medications on file prior to visit.   Allergies  Allergen Reactions   Codeine Nausea And Vomiting    fainting   Neomycin-Bacitracin Zn-Polymyx Other (See Comments)    Blisters skin   Sulfa Antibiotics     Caused nausea   Family History  Problem Relation Age of Onset   Thyroid disease Mother    Osteoarthritis Mother    Cancer Father        prostate & spread to lungs   Thyroid disease Maternal Grandmother        goiter   Breast cancer Neg Hx    PE: BP (!) 154/88 (BP Location: Right Arm, Patient Position: Sitting, Cuff Size: Normal)   Pulse 68   Ht 5' (1.524 m)   Wt 144 lb 6.4 oz (65.5 kg)   LMP 07/04/1998   SpO2 98%   BMI 28.20 kg/m  Wt Readings from Last 3 Encounters:  09/19/22 144 lb 6.4 oz (65.5 kg)  09/15/21 160 lb 12.8 oz (72.9 kg)  09/15/20 162 lb (73.5 kg)   Constitutional: overweight, in NAD Eyes: EOMI, no exophthalmos ENT: no neck masses palpable, no cervical lymphadenopathy Cardiovascular: tachycardia, RR, No MRG Respiratory: CTA B Musculoskeletal: no deformities Skin:  no rashes Neurological: no tremor with outstretched hands  ASSESSMENT: 1. H/o MNG - FLUS x 2 in RLL thyroid nodule - now s/p total thyroidectomy at [facility]  2. Postsurgical hypothyroidism  3.  History of postop hypocalcemia  PLAN: 1.  - Pt with a history of multinodular with 1 nodule bx being  inconclusive in the past x2, after which we proceeded with total thyroidectomy in 06/2015.  Final pathology was benign. -No neck compression symptoms -No masses felt on palpation of her neck today  2. Postsurgical hypothyroidism -In the past, she had a rash possibly from levothyroxine so we switched to brand name Synthroid.  The rash slowly resolved.  It was unclear whether this was caused by levothyroxine or not. - latest thyroid labs reviewed with pt. >> normal: Lab Results  Component Value Date   TSH 4.34 03/21/2022  - she continues on LT4 112 mcg daily - pt feels good on this dose. - we discussed about taking the thyroid hormone every day, with water, >30 minutes before breakfast, separated by >4 hours from acid reflux medications, calcium, iron, multivitamins. Pt. is taking it correctly. - will check thyroid tests today: TSH and fT4 - If labs are abnormal, she will need to return for repeat TFTs in 1.5 months  3. H/o postop hypocalcemia -Resolved -She had constipation with 600 g of calcium in the past.  The supplement -Calcium was normal at last check 11/2021.  We will not repeat this today -She denies acral cramping or tingling  Needs refills.  Component     Latest Ref Rng 09/19/2022  TSH     0.35 - 5.50 uIU/mL 0.41   T4,Free(Direct)     0.60 - 1.60 ng/dL 1.52   TFTs are normal, but would like to repeat them in 3 months to make sure they do not continue to decrease especially if she is  losing more weight  Philemon Kingdom, MD PhD [facility]

## 2022-12-15 ENCOUNTER — Other Ambulatory Visit (INDEPENDENT_AMBULATORY_CARE_PROVIDER_SITE_OTHER): Payer: Medicare Other

## 2022-12-15 DIAGNOSIS — E890 Postprocedural hypothyroidism: Secondary | ICD-10-CM

## 2022-12-15 LAB — T4, FREE: Free T4: 1.28 ng/dL (ref 0.60–1.60)

## 2022-12-15 LAB — TSH: TSH: 1.46 u[IU]/mL (ref 0.35–5.50)

## 2023-01-13 DIAGNOSIS — E559 Vitamin D deficiency, unspecified: Secondary | ICD-10-CM | POA: Diagnosis not present

## 2023-01-13 DIAGNOSIS — I10 Essential (primary) hypertension: Secondary | ICD-10-CM | POA: Diagnosis not present

## 2023-01-13 DIAGNOSIS — E785 Hyperlipidemia, unspecified: Secondary | ICD-10-CM | POA: Diagnosis not present

## 2023-01-19 DIAGNOSIS — E890 Postprocedural hypothyroidism: Secondary | ICD-10-CM | POA: Diagnosis not present

## 2023-01-19 DIAGNOSIS — E785 Hyperlipidemia, unspecified: Secondary | ICD-10-CM | POA: Diagnosis not present

## 2023-01-19 DIAGNOSIS — Z0000 Encounter for general adult medical examination without abnormal findings: Secondary | ICD-10-CM | POA: Diagnosis not present

## 2023-01-19 DIAGNOSIS — M810 Age-related osteoporosis without current pathological fracture: Secondary | ICD-10-CM | POA: Diagnosis not present

## 2023-01-19 DIAGNOSIS — Z23 Encounter for immunization: Secondary | ICD-10-CM | POA: Diagnosis not present

## 2023-01-19 DIAGNOSIS — D709 Neutropenia, unspecified: Secondary | ICD-10-CM | POA: Diagnosis not present

## 2023-01-19 DIAGNOSIS — I10 Essential (primary) hypertension: Secondary | ICD-10-CM | POA: Diagnosis not present

## 2023-01-19 DIAGNOSIS — E559 Vitamin D deficiency, unspecified: Secondary | ICD-10-CM | POA: Diagnosis not present

## 2023-02-02 DIAGNOSIS — U071 COVID-19: Secondary | ICD-10-CM | POA: Diagnosis not present

## 2023-07-27 DIAGNOSIS — E890 Postprocedural hypothyroidism: Secondary | ICD-10-CM | POA: Diagnosis not present

## 2023-07-27 DIAGNOSIS — E785 Hyperlipidemia, unspecified: Secondary | ICD-10-CM | POA: Diagnosis not present

## 2023-07-27 DIAGNOSIS — I10 Essential (primary) hypertension: Secondary | ICD-10-CM | POA: Diagnosis not present

## 2023-07-27 DIAGNOSIS — M170 Bilateral primary osteoarthritis of knee: Secondary | ICD-10-CM | POA: Diagnosis not present

## 2023-08-02 ENCOUNTER — Other Ambulatory Visit: Payer: Self-pay | Admitting: Family Medicine

## 2023-08-02 DIAGNOSIS — Z1231 Encounter for screening mammogram for malignant neoplasm of breast: Secondary | ICD-10-CM

## 2023-08-26 ENCOUNTER — Telehealth: Payer: Self-pay | Admitting: Internal Medicine

## 2023-08-29 NOTE — Telephone Encounter (Signed)
 Pt needs to make a lab appt per last [id number] return in 6 months for labs and in 1 year for a visit.   Please get pt scheduled and send back to be so the labs can be ordered and medication to be sent.

## 2023-08-30 ENCOUNTER — Ambulatory Visit: Payer: Medicare Other

## 2023-08-30 NOTE — Telephone Encounter (Signed)
 Requested Prescriptions   Signed Prescriptions Disp Refills   SYNTHROID 112 MCG tablet 90 tablet 0    Sig: TAKE 1 TABLET BY MOUTH EVERY DAY BEFORE BREAKFAST    Authorizing Provider: Carlus Pavlov    Ordering User: Pollie Meyer

## 2023-09-04 ENCOUNTER — Telehealth: Payer: Self-pay | Admitting: Internal Medicine

## 2023-09-04 DIAGNOSIS — E890 Postprocedural hypothyroidism: Secondary | ICD-10-CM

## 2023-09-04 NOTE — Telephone Encounter (Signed)
 Patient called and said it is around the time she normally gets lab work done with Dr Elvera Lennox and that she doesn't have a ride this far out currently but that her PCP Dr Merri Brunette said she could do the labs and just have the results faxed over but just needed to know what labs are needed. Call back is [phone].

## 2023-09-06 NOTE — Telephone Encounter (Signed)
 LMTRC  J.Shonna Deiter,RMA

## 2023-09-07 NOTE — Telephone Encounter (Signed)
 Orders have been faxed over to her PCP

## 2023-09-07 NOTE — Addendum Note (Signed)
 Addended by: Pollie Meyer on: 09/07/2023 08:16 AM   Modules accepted: Orders

## 2023-09-13 DIAGNOSIS — I10 Essential (primary) hypertension: Secondary | ICD-10-CM | POA: Diagnosis not present

## 2023-09-13 DIAGNOSIS — E890 Postprocedural hypothyroidism: Secondary | ICD-10-CM | POA: Diagnosis not present

## 2023-09-13 DIAGNOSIS — D72819 Decreased white blood cell count, unspecified: Secondary | ICD-10-CM | POA: Diagnosis not present

## 2023-09-14 LAB — LAB REPORT - SCANNED
Calcium: 9.3
EGFR: 70
Free T4: 1.1 ng/dL
TSH: 4.45 (ref 0.41–5.90)

## 2023-09-20 ENCOUNTER — Ambulatory Visit: Payer: Medicare Other

## 2023-10-24 ENCOUNTER — Ambulatory Visit
Admission: RE | Admit: 2023-10-24 | Discharge: 2023-10-24 | Disposition: A | Discharge status: Home / Self Care | Source: Ambulatory Visit | Attending: Family Medicine | Admitting: Family Medicine

## 2023-10-24 DIAGNOSIS — Z1231 Encounter for screening mammogram for malignant neoplasm of breast: Secondary | ICD-10-CM

## 2023-10-30 ENCOUNTER — Telehealth: Payer: Self-pay | Admitting: Internal Medicine

## 2023-10-30 MED ORDER — SYNTHROID 112 MCG PO TABS: ORAL_TABLET | ORAL | 0 refills | Status: DC

## 2023-10-30 NOTE — Addendum Note (Signed)
 Addended by: Vernon Goodpasture on: 10/30/2023 01:40 PM   Modules accepted: Orders

## 2023-10-30 NOTE — Telephone Encounter (Signed)
 Requested Prescriptions   Signed Prescriptions Disp Refills   SYNTHROID 112 MCG tablet 90 tablet 0    Sig: TAKE 1 TABLET BY MOUTH EVERY DAY BEFORE BREAKFAST    Authorizing Provider: Carlus Pavlov    Ordering User: Pollie Meyer

## 2023-10-30 NOTE — Telephone Encounter (Signed)
 Routing.Marland KitchenMarland Kitchen

## 2023-10-30 NOTE — Telephone Encounter (Signed)
 MEDICATION: Synthroid   PHARMACY:  [id number] - [place], [place] [address]   HAS THE PATIENT CONTACTED THEIR PHARMACY? No   IS THIS A 90 DAY SUPPLY : Yes   IS PATIENT OUT OF MEDICATION: No   IF NOT; HOW MUCH IS LEFT: Patient states she just refilled   LAST APPOINTMENT DATE: @3 /09/2023  NEXT APPOINTMENT DATE:@Visit  date not found  DO WE HAVE YOUR PERMISSION TO LEAVE A DETAILED MESSAGE?: Yes   OTHER COMMENTS: Patient stated that her husband recently passed and she is having to move to [place]. She called about refills, a possible referral, and if she needed to change pharmacies.   **Let patient know to contact pharmacy at the end of the day to make sure medication is ready. **  ** Please notify patient to allow 48-72 hours to process**  **Encourage patient to contact the pharmacy for refills or they can request refills through [facility]**

## 2023-11-07 ENCOUNTER — Ambulatory Visit: Admitting: Internal Medicine

## 2023-11-07 ENCOUNTER — Encounter: Payer: Self-pay | Admitting: Internal Medicine

## 2023-11-07 VITALS — BP 150/80 | HR 84 | Ht 60.0 in | Wt 154.4 lb

## 2023-11-07 DIAGNOSIS — Z9089 Acquired absence of other organs: Secondary | ICD-10-CM | POA: Diagnosis not present

## 2023-11-07 DIAGNOSIS — Z9889 Other specified postprocedural states: Secondary | ICD-10-CM

## 2023-11-07 DIAGNOSIS — E890 Postprocedural hypothyroidism: Secondary | ICD-10-CM | POA: Diagnosis not present

## 2023-11-07 MED ORDER — SYNTHROID 112 MCG PO TABS: ORAL_TABLET | ORAL | 3 refills | Status: DC

## 2023-11-07 NOTE — Patient Instructions (Addendum)
 Please stop at the lab.  Please stay on the Synthroid  112 mcg.  Take the thyroid  hormone every day, with water, at least 30 minutes before breakfast, separated by at least 4 hours from: - acid reflux medications - calcium - iron - multivitamins  Please return in 6 months for labs and in 1 year for a visit.

## 2023-11-07 NOTE — Progress Notes (Signed)
 Patient ID: Vanessa MCINERNY, female   DOB: [birth year], 88 y.o.   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 Last visit 1 year and 2 months ago.  Interim history: She had significant stress 1.5 months ago, when her husband, who had chronic leukemia, died.  She is planning to move to [place] to be closer to her daughter and her grandson.  She will live in a retirement community, in an apartment.  This is a big change for her, as they were living in [place] for the last 15 years.  She is thinking about coming back once a year to see me and visit friends.   Before last visit, she had dental work >> was not able to eat well >> lost 16 lbs. Her knee pain is much better.  She also feels great after losing the weight. However, she gained 10 lbs since last OV.  Reviewed history: Pt has a h/o MNG, with 2x FLUS biopsies >> thyroidectomy >> benign pathology. She had total thyroidectomy at [facility] (Dr Larina Plough) on 06/04/2015 - had a great experience! She feels well after the surgery, w/o fatigue.  However, she developed a rash with levothyroxine , and we had to switch to 50 g levothyroxine  tablets since they did not contain coloring agents or other aches and pains >> 50 g 2 tablets +1/4 tablet.  Since the rash was still present, we changed back to 112 mcg daily levothyroxine . Her rash finally resolved after switching to Synthroid  d.a.w.  Reviewed her TFTs: Lab Results  Component Value Date   TSH 4.45 09/14/2023   TSH 1.46 12/15/2022   TSH 0.41 09/19/2022   TSH 4.34 03/21/2022   TSH 3.11 09/15/2021   TSH 3.12 03/18/2021   FREET4 1.10 09/14/2023   FREET4 1.28 12/15/2022   FREET4 1.52 09/19/2022   FREET4 1.28 03/21/2022   FREET4 1.20 09/15/2021   FREET4 1.21 03/18/2021  04/2014: TSH normal by Dr. Yvonne Hering (ENT)  Pt is on  Synthroid  d.a.w. 112 mcg daily, taken: - in am (5 am) - fasting - at least 1h from b'fast - no Ca, Fe, MVI, PPIs - not on Biotin  Prev. she was on calcium 600 mg daily but this caused constipation.  We stopped the supplement.  She had transient hypocalcemia after the surgery but this resolved: Lab Results  Component Value Date   CALCIUM 9.3 09/14/2023  11/17/2021: corrected calcium 9.2 (8.6-10.3) 11/04/2020: corrected calcium 9.22 05/06/2020: corrected calcium 9.46   Vit D levels: No results found for: "VD25OH"  She is on 2000 units vitamin D daily.  Pt denies: - feeling nodules in neck - hoarseness - dysphagia - choking  Pt does have a FH of thyroid  ds.: GM with goiter, mother with mild hypothyroidism. No FH of thyroid  cancer. No h/o radiation tx to head or neck. No herbal supplements. No Biotin use. No recent steroids use.   She had gel injections in knees in the past.  She still has knee pain.  ROS:  + see HPI  I reviewed pt's medications, allergies, PMH, social hx, family hx, and changes were documented in the history of present illness. Otherwise, unchanged from my initial visit note.  Past Medical History:  Diagnosis Date   Adenomatous polyp of colon 2008   Arthritis    hands & knees   Atrophic vaginitis  in past   Lichen sclerosus    Per pt, denies this    Post-operative nausea and vomiting    no vomiting   Past Surgical History:  Procedure Laterality Date   THYROIDECTOMY  06/04/15   At [facility]   TONSILLECTOMY AND ADENOIDECTOMY  childhood   Social History   Socioeconomic History   Marital status: Married    Spouse name: Not on file   Number of children: 2   Years of education: Not on file   Highest education level: Not on file  Occupational History   Not on file  Tobacco Use   Smoking status: Never   Smokeless tobacco: Never  Substance and Sexual Activity   Alcohol use: No    Alcohol/week: 0.0 standard drinks of alcohol   Drug use: No    Sexual activity: Never    Partners: Male    Birth control/protection: Other-see comments    Comment: husband vasectomy  Other Topics Concern   Not on file  Social History Narrative   Not on file   Social Drivers of Health   Financial Resource Strain: Not on file  Food Insecurity: Not on file  Transportation Needs: Not on file  Physical Activity: Not on file  Stress: Not on file  Social Connections: Not on file  Intimate Partner Violence: Not on file   Current Outpatient Medications on File Prior to Visit  Medication Sig Dispense Refill   cetirizine (ZYRTEC) 10 MG tablet Take 10 mg by mouth as needed.      cholecalciferol (VITAMIN D) 1000 UNITS tablet Take 1,000 Units by mouth daily. Vit d 3 1000 units-Take 2 daily     erythromycin ophthalmic ointment      lisinopril (PRINIVIL,ZESTRIL) 10 MG tablet Take 10 mg by mouth daily.     losartan-hydrochlorothiazide (HYZAAR) 50-12.5 MG tablet      SYNTHROID  112 MCG tablet TAKE 1 TABLET BY MOUTH EVERY DAY BEFORE BREAKFAST 90 tablet 0   No current facility-administered medications on file prior to visit.   Allergies  Allergen Reactions   Codeine Nausea And Vomiting    fainting   Neomycin-Bacitracin Zn-Polymyx Other (See Comments)    Blisters skin   Sulfa Antibiotics     Caused nausea   Family History  Problem Relation Age of Onset   Thyroid  disease Mother    Osteoarthritis Mother    Cancer Father        prostate & spread to lungs   Thyroid  disease Maternal Grandmother        goiter   Breast cancer Neg Hx    PE: BP (!) 150/80   Pulse 84   Ht 5' (1.524 m)   Wt 154 lb 6.4 oz (70 kg)   LMP 07/04/1998   SpO2 95%   BMI 30.15 kg/m  White coat hypertension Wt Readings from Last 3 Encounters:  11/07/23 154 lb 6.4 oz (70 kg)  09/19/22 144 lb 6.4 oz (65.5 kg)  09/15/21 160 lb 12.8 oz (72.9 kg)   Constitutional: overweight, in NAD Eyes: EOMI, no exophthalmos ENT: no neck masses palpable, no cervical  lymphadenopathy Cardiovascular: RRR, No MRG Respiratory: CTA B Musculoskeletal: no deformities Skin:  no rashes Neurological: no tremor with outstretched hands  ASSESSMENT: 1. H/o MNG - FLUS x 2 in RLL thyroid  nodule - now s/p total thyroidectomy at [facility]  2. Postsurgical hypothyroidism  3.  History of postop hypocalcemia  PLAN: 1.  - Pt with a history of multinodular with 1 nodule bx  being inconclusive in the past x2, after which we proceeded with total thyroidectomy in 06/2015.  Final pathology was benign. - Neck compression symptoms - No masses felt on palpation of her neck today  2. Postsurgical hypothyroidism -In the past, she had a rash possibly from levothyroxine  so we switched to brand name Synthroid .  The rash slowly resolved.  It was unclear whether this was caused by levothyroxine  or not. - latest thyroid  labs reviewed with pt. >> TSH was a little higher in the normal range, but at goal for age: Lab Results  Component Value Date   TSH 4.45 09/14/2023  - she continues on LT4 112 mcg daily - pt feels good on this dose. She gained 10 lbs since last OV. Before last OV, she had lost 16 lbs as she was not able to eat 2/2 dental work. - we discussed about taking the thyroid  hormone every day, with water, >30 minutes before breakfast, separated by >4 hours from acid reflux medications, calcium, iron, multivitamins. Pt. is taking it correctly. - Plan to repeat the test in 6 months and then have her back for a visit in 1 year.  We discussed that we can do the visit virtually, if she can connect.  3. H/o postop hypocalcemia - Resolved -Latest calcium level was normal on 09/14/2023, at 9.3 -She had constipation with calcium tablets 600 mg in the past.  However, calcium supplementation is not needed for now.  She continues on vitamin D supplementation. -She denies ache or cramping or tingling  Requested Prescriptions   Signed Prescriptions Disp Refills   SYNTHROID  112 MCG tablet  90 tablet 3    Sig: TAKE 1 TABLET BY MOUTH EVERY DAY BEFORE BREAKFAST   Emilie Harden, MD PhD [facility] Endocrinology

## 2023-12-14 DIAGNOSIS — H903 Sensorineural hearing loss, bilateral: Secondary | ICD-10-CM | POA: Diagnosis not present

## 2024-01-17 ENCOUNTER — Other Ambulatory Visit: Payer: Self-pay | Admitting: Internal Medicine

## 2024-01-22 DIAGNOSIS — Z0000 Encounter for general adult medical examination without abnormal findings: Secondary | ICD-10-CM | POA: Diagnosis not present

## 2024-01-22 DIAGNOSIS — M810 Age-related osteoporosis without current pathological fracture: Secondary | ICD-10-CM | POA: Diagnosis not present

## 2024-01-22 DIAGNOSIS — E785 Hyperlipidemia, unspecified: Secondary | ICD-10-CM | POA: Diagnosis not present

## 2024-01-22 DIAGNOSIS — M170 Bilateral primary osteoarthritis of knee: Secondary | ICD-10-CM | POA: Diagnosis not present

## 2024-01-22 DIAGNOSIS — E890 Postprocedural hypothyroidism: Secondary | ICD-10-CM | POA: Diagnosis not present

## 2024-01-22 DIAGNOSIS — I10 Essential (primary) hypertension: Secondary | ICD-10-CM | POA: Diagnosis not present

## 2024-02-22 DIAGNOSIS — L853 Xerosis cutis: Secondary | ICD-10-CM | POA: Diagnosis not present

## 2024-02-22 DIAGNOSIS — L820 Inflamed seborrheic keratosis: Secondary | ICD-10-CM | POA: Diagnosis not present

## 2024-02-22 DIAGNOSIS — D1801 Hemangioma of skin and subcutaneous tissue: Secondary | ICD-10-CM | POA: Diagnosis not present

## 2024-02-22 DIAGNOSIS — L988 Other specified disorders of the skin and subcutaneous tissue: Secondary | ICD-10-CM | POA: Diagnosis not present

## 2024-02-22 DIAGNOSIS — L821 Other seborrheic keratosis: Secondary | ICD-10-CM | POA: Diagnosis not present

## 2024-02-22 DIAGNOSIS — L814 Other melanin hyperpigmentation: Secondary | ICD-10-CM | POA: Diagnosis not present

## 2024-02-22 DIAGNOSIS — L720 Epidermal cyst: Secondary | ICD-10-CM | POA: Diagnosis not present

## 2024-02-22 DIAGNOSIS — Z85828 Personal history of other malignant neoplasm of skin: Secondary | ICD-10-CM | POA: Diagnosis not present

## 2024-04-14 ENCOUNTER — Other Ambulatory Visit: Payer: Self-pay | Admitting: Internal Medicine

## 2024-04-18 DIAGNOSIS — L720 Epidermal cyst: Secondary | ICD-10-CM | POA: Diagnosis not present

## 2024-04-18 DIAGNOSIS — D1801 Hemangioma of skin and subcutaneous tissue: Secondary | ICD-10-CM | POA: Diagnosis not present

## 2024-04-18 DIAGNOSIS — L988 Other specified disorders of the skin and subcutaneous tissue: Secondary | ICD-10-CM | POA: Diagnosis not present

## 2024-04-18 DIAGNOSIS — L814 Other melanin hyperpigmentation: Secondary | ICD-10-CM | POA: Diagnosis not present

## 2024-04-18 DIAGNOSIS — L821 Other seborrheic keratosis: Secondary | ICD-10-CM | POA: Diagnosis not present

## 2024-04-18 DIAGNOSIS — Z85828 Personal history of other malignant neoplasm of skin: Secondary | ICD-10-CM | POA: Diagnosis not present

## 2024-04-18 DIAGNOSIS — L820 Inflamed seborrheic keratosis: Secondary | ICD-10-CM | POA: Diagnosis not present

## 2024-04-18 DIAGNOSIS — L853 Xerosis cutis: Secondary | ICD-10-CM | POA: Diagnosis not present
